# Patient Record
Sex: Female | Born: 1945 | Race: White | Hispanic: No | Marital: Married | Smoking: Never smoker
Health system: Southern US, Community
[De-identification: ages and names within clinical notes are randomized; demographics above are authoritative.]

## PROBLEM LIST (undated history)

## (undated) DIAGNOSIS — M1990 Unspecified osteoarthritis, unspecified site: Secondary | ICD-10-CM

## (undated) DIAGNOSIS — I10 Essential (primary) hypertension: Secondary | ICD-10-CM

## (undated) DIAGNOSIS — T7840XA Allergy, unspecified, initial encounter: Secondary | ICD-10-CM

## (undated) DIAGNOSIS — C801 Malignant (primary) neoplasm, unspecified: Secondary | ICD-10-CM

## (undated) DIAGNOSIS — J45909 Unspecified asthma, uncomplicated: Secondary | ICD-10-CM

## (undated) DIAGNOSIS — E039 Hypothyroidism, unspecified: Secondary | ICD-10-CM

## (undated) HISTORY — PX: NISSEN FUNDOPLICATION: SHX2091

## (undated) HISTORY — PX: TONSILLECTOMY: SUR1361

## (undated) HISTORY — DX: Allergy, unspecified, initial encounter: T78.40XA

## (undated) HISTORY — PX: EYE SURGERY: SHX253

## (undated) HISTORY — PX: EAR MASTOIDECTOMY W/ COCHLEAR IMPLANT W/ LANDMARK: SHX1483

---

## 2011-03-19 DIAGNOSIS — Z9889 Other specified postprocedural states: Secondary | ICD-10-CM | POA: Insufficient documentation

## 2014-09-10 HISTORY — PX: OSSICULAR RECONSTRUCTION: SHX2135

## 2014-09-25 DIAGNOSIS — J302 Other seasonal allergic rhinitis: Secondary | ICD-10-CM | POA: Insufficient documentation

## 2014-09-25 DIAGNOSIS — I10 Essential (primary) hypertension: Secondary | ICD-10-CM | POA: Insufficient documentation

## 2014-09-25 DIAGNOSIS — E039 Hypothyroidism, unspecified: Secondary | ICD-10-CM | POA: Insufficient documentation

## 2014-10-09 DIAGNOSIS — H90A32 Mixed conductive and sensorineural hearing loss, unilateral, left ear with restricted hearing on the contralateral side: Secondary | ICD-10-CM | POA: Insufficient documentation

## 2015-09-10 DIAGNOSIS — E782 Mixed hyperlipidemia: Secondary | ICD-10-CM | POA: Insufficient documentation

## 2015-09-10 DIAGNOSIS — M1990 Unspecified osteoarthritis, unspecified site: Secondary | ICD-10-CM | POA: Insufficient documentation

## 2015-09-28 HISTORY — PX: TYMPANOPLASTY: SHX33

## 2017-09-16 DIAGNOSIS — H9190 Unspecified hearing loss, unspecified ear: Secondary | ICD-10-CM | POA: Insufficient documentation

## 2017-09-16 DIAGNOSIS — I8390 Asymptomatic varicose veins of unspecified lower extremity: Secondary | ICD-10-CM | POA: Insufficient documentation

## 2017-11-20 DIAGNOSIS — Z9889 Other specified postprocedural states: Secondary | ICD-10-CM | POA: Insufficient documentation

## 2017-11-20 DIAGNOSIS — H74312 Ankylosis of ear ossicles, left ear: Secondary | ICD-10-CM | POA: Insufficient documentation

## 2017-11-25 DIAGNOSIS — I8390 Asymptomatic varicose veins of unspecified lower extremity: Secondary | ICD-10-CM | POA: Insufficient documentation

## 2019-06-14 ENCOUNTER — Encounter: Payer: Self-pay | Admitting: *Deleted

## 2020-04-24 ENCOUNTER — Other Ambulatory Visit: Payer: Self-pay | Admitting: Orthopedic Surgery

## 2020-04-24 DIAGNOSIS — M19011 Primary osteoarthritis, right shoulder: Secondary | ICD-10-CM

## 2020-05-08 ENCOUNTER — Ambulatory Visit
Admission: RE | Admit: 2020-05-08 | Discharge: 2020-05-08 | Disposition: A | Discharge status: Home / Self Care | Payer: Medicare Other | Source: Ambulatory Visit | Attending: Orthopedic Surgery | Admitting: Orthopedic Surgery

## 2020-05-08 ENCOUNTER — Other Ambulatory Visit: Payer: Self-pay

## 2020-05-08 DIAGNOSIS — M19011 Primary osteoarthritis, right shoulder: Secondary | ICD-10-CM

## 2020-06-20 NOTE — Patient Instructions (Addendum)
DUE TO COVID-19 ONLY ONE VISITOR IS ALLOWED TO COME WITH YOU AND STAY IN THE WAITING ROOM ONLY DURING PRE OP AND PROCEDURE DAY OF SURGERY. THE 1 VISITOR  MAY VISIT WITH YOU AFTER SURGERY IN YOUR PRIVATE ROOM DURING VISITING HOURS ONLY!  YOU NEED TO HAVE A COVID 19 TEST ON: 07/02/20 @ 10:30 am , THIS TEST MUST BE DONE BEFORE SURGERY,  COVID TESTING SITE [place] [id number], IT IS ON THE RIGHT GOING OUT [address] APPROXIMATELY  2 MINUTES PAST [employer] ON THE RIGHT. ONCE YOUR COVID TEST IS COMPLETED,  PLEASE BEGIN THE QUARANTINE INSTRUCTIONS AS OUTLINED IN YOUR HANDOUT.                Chelsea Mcguire    Your procedure is scheduled on: 07/05/20   Report to [facility] Main  Entrance   Report to short stay at: 5:30 AM     Call this number if you have problems the morning of surgery [phone]    Remember:   NO SOLID FOOD AFTER MIDNIGHT THE NIGHT PRIOR TO SURGERY. NOTHING BY MOUTH EXCEPT CLEAR LIQUIDS UNTIL: 4:30 am . PLEASE FINISH ENSURE DRINK PER SURGEON ORDER  WHICH NEEDS TO BE COMPLETED AT: 4:30 am.  CLEAR LIQUID DIET   Foods Allowed                                                                     Foods Excluded  Coffee and tea, regular and decaf                             liquids that you cannot  Plain Jell-O any favor except red or purple                                           see through such as: Fruit ices (not with fruit pulp)                                     milk, soups, orange juice  Iced Popsicles                                    All solid food Carbonated beverages, regular and diet                                    Cranberry, grape and apple juices Sports drinks like Gatorade Lightly seasoned clear broth or consume(fat free) Sugar, honey syrup  Sample Menu Breakfast                                Lunch  Supper Cranberry juice                    Beef broth                             Chicken broth Jell-O                                     Grape juice                           Apple juice Coffee or tea                        Jell-O                                      Popsicle                                                Coffee or tea                        Coffee or tea  _____________________________________________________________________   BRUSH YOUR TEETH MORNING OF SURGERY AND RINSE YOUR MOUTH OUT, NO CHEWING GUM CANDY OR MINTS.     Take these medicines the morning of surgery with A SIP OF WATER: amlodipine,levothyroxine.                                You may not have any metal on your body including hair pins and              piercings  Do not wear jewelry, make-up, lotions, powders or perfumes, deodorant             Do not wear nail polish on your fingernails.  Do not shave  48 hours prior to surgery.             Do not bring valuables to the hospital. [place].  Contacts, dentures or bridgework may not be worn into surgery.  Leave suitcase in the car. After surgery it may be brought to your room.     Patients discharged the day of surgery will not be allowed to drive home. IF YOU ARE HAVING SURGERY AND GOING HOME THE SAME DAY, YOU MUST HAVE AN ADULT TO DRIVE YOU HOME AND BE WITH YOU FOR 24 HOURS. YOU MAY GO HOME BY TAXI OR UBER OR ORTHERWISE, BUT AN ADULT MUST ACCOMPANY YOU HOME AND STAY WITH YOU FOR 24 HOURS.  Name and phone number of your driver:  Special Instructions: N/A              Please read over the following fact sheets you were given: _____________________________________________________________________        [facility] - Preparing for Surgery Before surgery, you can play an important role.  Because skin is not sterile, your skin needs to  be as free of germs as possible.  You can reduce the number of germs on your skin by washing with CHG (chlorahexidine gluconate) soap before surgery.   CHG is an antiseptic cleaner which kills germs and bonds with the skin to continue killing germs even after washing. Please DO NOT use if you have an allergy to CHG or antibacterial soaps.  If your skin becomes reddened/irritated stop using the CHG and inform your nurse when you arrive at Short Stay. Do not shave (including legs and underarms) for at least 48 hours prior to the first CHG shower.  You may shave your face/neck. Please follow these instructions carefully:  1.  Shower with CHG Soap the night before surgery and the  morning of Surgery.  2.  If you choose to wash your hair, wash your hair first as usual with your  normal  shampoo.  3.  After you shampoo, rinse your hair and body thoroughly to remove the  shampoo.                           4.  Use CHG as you would any other liquid soap.  You can apply chg directly  to the skin and wash                       Gently with a scrungie or clean washcloth.  5.  Apply the CHG Soap to your body ONLY FROM THE NECK DOWN.   Do not use on face/ open                           Wound or open sores. Avoid contact with eyes, ears mouth and genitals (private parts).                       Wash face,  Genitals (private parts) with your normal soap.             6.  Wash thoroughly, paying special attention to the area where your surgery  will be performed.  7.  Thoroughly rinse your body with warm water from the neck down.  8.  DO NOT shower/wash with your normal soap after using and rinsing off  the CHG Soap.                9.  Pat yourself dry with a clean towel.            10.  Wear clean pajamas.            11.  Place clean sheets on your bed the night of your first shower and do not  sleep with pets. Day of Surgery : Do not apply any lotions/deodorants the morning of surgery.  Please wear clean clothes to the hospital/surgery center.  FAILURE TO FOLLOW THESE INSTRUCTIONS MAY RESULT IN THE CANCELLATION OF YOUR SURGERY PATIENT  SIGNATURE_________________________________  NURSE SIGNATURE__________________________________  ________________________________________________________________________  [facility]- Preparing for Total Shoulder Arthroplasty    Before surgery, you can play an important role. Because skin is not sterile, your skin needs to be as free of germs as possible. You can reduce the number of germs on your skin by using the following products.  Benzoyl Peroxide Gel o Reduces the number of germs present on the skin o Applied twice a day to shoulder area starting two days before surgery    ==================================================================  Please follow these instructions carefully:  BENZOYL PEROXIDE 5% GEL  Please do not use if you have an allergy to benzoyl peroxide.   If your skin becomes reddened/irritated stop using the benzoyl peroxide.  Starting two days before surgery, apply as follows: 1. Apply benzoyl peroxide in the morning and at night. Apply after taking a shower. If you are not taking a shower clean entire shoulder front, back, and side along with the armpit with a clean wet washcloth.  2. Place a quarter-sized dollop on your shoulder and rub in thoroughly, making sure to cover the front, back, and side of your shoulder, along with the armpit.   2 days before ____ AM   ____ PM              1 day before ____ AM   ____ PM                         3. Do this twice a day for two days.  (Last application is the night before surgery, AFTER using the CHG soap as described below).  4. Do NOT apply benzoyl peroxide gel on the day of surgery.   Incentive Spirometer  An incentive spirometer is a tool that can help keep your lungs clear and active. This tool measures how well you are filling your lungs with each breath. Taking long deep breaths may help reverse or decrease the chance of developing breathing (pulmonary) problems (especially infection) following:  A long  period of time when you are unable to move or be active. BEFORE THE PROCEDURE   If the spirometer includes an indicator to show your best effort, your nurse or respiratory therapist will set it to a desired goal.  If possible, sit up straight or lean slightly forward. Try not to slouch.  Hold the incentive spirometer in an upright position. INSTRUCTIONS FOR USE  1. Sit on the edge of your bed if possible, or sit up as far as you can in bed or on a chair. 2. Hold the incentive spirometer in an upright position. 3. Breathe out normally. 4. Place the mouthpiece in your mouth and seal your lips tightly around it. 5. Breathe in slowly and as deeply as possible, raising the piston or the ball toward the top of the column. 6. Hold your breath for 3-5 seconds or for as long as possible. Allow the piston or ball to fall to the bottom of the column. 7. Remove the mouthpiece from your mouth and breathe out normally. 8. Rest for a few seconds and repeat Steps 1 through 7 at least 10 times every 1-2 hours when you are awake. Take your time and take a few normal breaths between deep breaths. 9. The spirometer may include an indicator to show your best effort. Use the indicator as a goal to work toward during each repetition. 10. After each set of 10 deep breaths, practice coughing to be sure your lungs are clear. If you have an incision (the cut made at the time of surgery), support your incision when coughing by placing a pillow or rolled up towels firmly against it. Once you are able to get out of bed, walk around indoors and cough well. You may stop using the incentive spirometer when instructed by your caregiver.  RISKS AND COMPLICATIONS  Take your time so you do not get dizzy or light-headed.  If you are in pain, you may need to take or ask for  pain medication before doing incentive spirometry. It is harder to take a deep breath if you are having pain. AFTER USE  Rest and breathe slowly and  easily.  It can be helpful to keep track of a log of your progress. Your caregiver can provide you with a simple table to help with this. If you are using the spirometer at home, follow these instructions: [place] IF:   You are having difficultly using the spirometer.  You have trouble using the spirometer as often as instructed.  Your pain medication is not giving enough relief while using the spirometer.  You develop fever of 100.5 F (38.1 C) or higher. SEEK IMMEDIATE MEDICAL CARE IF:   You cough up bloody sputum that had not been present before.  You develop fever of 102 F (38.9 C) or greater.  You develop worsening pain at or near the incision site. MAKE SURE YOU:   Understand these instructions.  Will watch your condition.  Will get help right away if you are not doing well or get worse. Document Released: 03/09/2007 Document Revised: 01/19/2012 Document Reviewed: 05/10/2007 [facility] Patient Information 2014 [place], [place].   ________________________________________________________________________

## 2020-06-21 ENCOUNTER — Encounter (HOSPITAL_COMMUNITY)
Admission: RE | Admit: 2020-06-21 | Discharge: 2020-06-21 | Disposition: A | Discharge status: Home / Self Care | Payer: Medicare Other | Source: Ambulatory Visit | Attending: Orthopedic Surgery | Admitting: Orthopedic Surgery

## 2020-06-21 ENCOUNTER — Encounter (HOSPITAL_COMMUNITY): Payer: Self-pay

## 2020-06-21 ENCOUNTER — Other Ambulatory Visit: Payer: Self-pay

## 2020-06-21 DIAGNOSIS — Z01818 Encounter for other preprocedural examination: Secondary | ICD-10-CM | POA: Diagnosis present

## 2020-06-21 HISTORY — DX: Hypothyroidism, unspecified: E03.9

## 2020-06-21 HISTORY — DX: Malignant (primary) neoplasm, unspecified: C80.1

## 2020-06-21 HISTORY — DX: Unspecified osteoarthritis, unspecified site: M19.90

## 2020-06-21 HISTORY — DX: Unspecified asthma, uncomplicated: J45.909

## 2020-06-21 HISTORY — DX: Essential (primary) hypertension: I10

## 2020-06-21 LAB — SURGICAL PCR SCREEN
MRSA, PCR: NEGATIVE
Staphylococcus aureus: NEGATIVE

## 2020-06-21 LAB — CBC
HCT: 42.5 % (ref 36.0–46.0)
Hemoglobin: 13.8 g/dL (ref 12.0–15.0)
MCH: 30.4 pg (ref 26.0–34.0)
MCHC: 32.5 g/dL (ref 30.0–36.0)
MCV: 93.6 fL (ref 80.0–100.0)
Platelets: 279 10*3/uL (ref 150–400)
RBC: 4.54 MIL/uL (ref 3.87–5.11)
RDW: 12.7 % (ref 11.5–15.5)
WBC: 9.6 10*3/uL (ref 4.0–10.5)
nRBC: 0 % (ref 0.0–0.2)

## 2020-06-21 LAB — BASIC METABOLIC PANEL
Anion gap: 6 (ref 5–15)
BUN: 13 mg/dL (ref 8–23)
CO2: 28 mmol/L (ref 22–32)
Calcium: 9.3 mg/dL (ref 8.9–10.3)
Chloride: 105 mmol/L (ref 98–111)
Creatinine, Ser: 0.71 mg/dL (ref 0.44–1.00)
GFR calc Af Amer: >60 mL/min (ref >60)
GFR calc non Af Amer: >60 mL/min (ref >60)
Glucose, Bld: 95 mg/dL (ref 70–99)
Potassium: 4.8 mmol/L (ref 3.5–5.1)
Sodium: 139 mmol/L (ref 135–145)

## 2020-06-21 NOTE — Progress Notes (Addendum)
COVID Vaccine Completed:yes Date COVID Vaccine completed:01/2020 COVID vaccine manufacturer: Pfizer   * Moderna   [employer]   PCP - Dr. Lonia Mad.: [facility] Cardiologist -   Chest x-ray -  EKG -  Stress Test -  ECHO -  Cardiac Cath -   Sleep Study -  CPAP -   Fasting Blood Sugar -  Checks Blood Sugar _____ times a day  Blood Thinner Instructions: Aspirin Instructions: Last Dose:  Anesthesia review:   Patient denies shortness of breath, fever, cough and chest pain at PAT appointment   Patient verbalized understanding of instructions that were given to them at the PAT appointment. Patient was also instructed that they will need to review over the PAT instructions again at home before surgery.

## 2020-07-02 ENCOUNTER — Other Ambulatory Visit (HOSPITAL_COMMUNITY)
Admission: RE | Admit: 2020-07-02 | Discharge: 2020-07-02 | Disposition: A | Discharge status: Home / Self Care | Payer: Medicare Other | Source: Ambulatory Visit | Attending: Orthopedic Surgery | Admitting: Orthopedic Surgery

## 2020-07-02 DIAGNOSIS — Z20822 Contact with and (suspected) exposure to covid-19: Secondary | ICD-10-CM | POA: Insufficient documentation

## 2020-07-02 DIAGNOSIS — Z01812 Encounter for preprocedural laboratory examination: Secondary | ICD-10-CM | POA: Insufficient documentation

## 2020-07-02 LAB — SARS CORONAVIRUS 2 (TAT 6-24 HRS): SARS Coronavirus 2: NEGATIVE

## 2020-07-04 NOTE — Anesthesia Preprocedure Evaluation (Addendum)
Anesthesia Evaluation  Patient identified by MRN, date of birth, ID band  Reviewed: Allergy & Precautions, NPO status , Patient's Chart, lab work & pertinent test results  Airway Mallampati: II  TM Distance: >3 FB Neck ROM: Full    Dental no notable dental hx.    Pulmonary asthma ,    Pulmonary exam normal breath sounds clear to auscultation       Cardiovascular Exercise Tolerance: Good hypertension, Normal cardiovascular exam Rhythm:Regular Rate:Normal  Normal sinus rhythm with sinus arrhythmia Right bundle branch block No old tracing to compare Confirmed by Minus Breeding [phone]) on 06/21/2020 8:59:59 PM   Neuro/Psych negative neurological ROS  negative psych ROS   GI/Hepatic   Endo/Other  Hypothyroidism   Renal/GU      Musculoskeletal  (+) Arthritis , Osteoarthritis,    Abdominal   Peds  Hematology   Anesthesia Other Findings   Reproductive/Obstetrics                            Anesthesia Physical Anesthesia Plan  ASA: II  Anesthesia Plan: General and Regional   Post-op Pain Management: GA combined w/ Regional for post-op pain   Induction:   PONV Risk Score and Plan: 3 and Dexamethasone, Ondansetron and Treatment may vary due to age or medical condition  Airway Management Planned: Oral ETT  Additional Equipment:   Intra-op Plan:   Post-operative Plan:   Informed Consent: I have reviewed the patients History and Physical, chart, labs and discussed the procedure including the risks, benefits and alternatives for the proposed anesthesia with the patient or authorized representative who has indicated his/her understanding and acceptance.       Plan Discussed with:   Anesthesia Plan Comments: (Interscalene block with Exparel + GETA. )       Anesthesia Quick Evaluation

## 2020-07-05 ENCOUNTER — Encounter (HOSPITAL_COMMUNITY): Payer: Self-pay | Admitting: Orthopedic Surgery

## 2020-07-05 ENCOUNTER — Ambulatory Visit (HOSPITAL_COMMUNITY)
Admission: RE | Admit: 2020-07-05 | Discharge: 2020-07-05 | Disposition: A | Discharge status: Home / Self Care | Payer: Medicare Other | Source: Other Acute Inpatient Hospital | Attending: Orthopedic Surgery | Admitting: Orthopedic Surgery

## 2020-07-05 ENCOUNTER — Ambulatory Visit (HOSPITAL_COMMUNITY): Payer: Medicare Other | Admitting: Anesthesiology

## 2020-07-05 ENCOUNTER — Other Ambulatory Visit: Payer: Self-pay

## 2020-07-05 ENCOUNTER — Encounter (HOSPITAL_COMMUNITY)
Admission: RE | Disposition: A | Discharge status: Home / Self Care | Payer: Self-pay | Source: Other Acute Inpatient Hospital | Attending: Orthopedic Surgery

## 2020-07-05 DIAGNOSIS — J45909 Unspecified asthma, uncomplicated: Secondary | ICD-10-CM | POA: Diagnosis not present

## 2020-07-05 DIAGNOSIS — M19011 Primary osteoarthritis, right shoulder: Secondary | ICD-10-CM | POA: Insufficient documentation

## 2020-07-05 DIAGNOSIS — Z79899 Other long term (current) drug therapy: Secondary | ICD-10-CM | POA: Diagnosis not present

## 2020-07-05 DIAGNOSIS — E039 Hypothyroidism, unspecified: Secondary | ICD-10-CM | POA: Diagnosis not present

## 2020-07-05 DIAGNOSIS — I10 Essential (primary) hypertension: Secondary | ICD-10-CM | POA: Insufficient documentation

## 2020-07-05 DIAGNOSIS — Z79890 Hormone replacement therapy (postmenopausal): Secondary | ICD-10-CM | POA: Insufficient documentation

## 2020-07-05 HISTORY — PX: TOTAL SHOULDER ARTHROPLASTY: SHX126

## 2020-07-05 SURGERY — ARTHROPLASTY, SHOULDER, TOTAL
Anesthesia: Regional | Site: Shoulder | Laterality: Right

## 2020-07-05 MED ORDER — CEFAZOLIN SODIUM-DEXTROSE 2-4 GM/100ML-% IV SOLN
2.0000 g | INTRAVENOUS | Status: AC
  Administered 2020-07-05: 2 g via INTRAVENOUS
  Filled 2020-07-05: qty 100

## 2020-07-05 MED ORDER — LACTATED RINGERS IV BOLUS
250.0000 mL | Freq: Once | INTRAVENOUS | Status: AC
  Administered 2020-07-05: 250 mL via INTRAVENOUS

## 2020-07-05 MED ORDER — FENTANYL CITRATE (PF) 100 MCG/2ML IJ SOLN
25.0000 ug | INTRAMUSCULAR | Status: DC | PRN
  Administered 2020-07-05: 50 ug via INTRAVENOUS

## 2020-07-05 MED ORDER — 0.9 % SODIUM CHLORIDE (POUR BTL) OPTIME
TOPICAL | Status: DC | PRN
  Administered 2020-07-05: 1000 mL

## 2020-07-05 MED ORDER — PROPOFOL 10 MG/ML IV BOLUS
INTRAVENOUS | Status: DC | PRN
  Administered 2020-07-05: 130 mg via INTRAVENOUS

## 2020-07-05 MED ORDER — LACTATED RINGERS IV BOLUS
500.0000 mL | Freq: Once | INTRAVENOUS | Status: AC
  Administered 2020-07-05: 500 mL via INTRAVENOUS

## 2020-07-05 MED ORDER — PHENYLEPHRINE HCL-NACL 10-0.9 MG/250ML-% IV SOLN
INTRAVENOUS | Status: DC | PRN
  Administered 2020-07-05: 50 ug/min via INTRAVENOUS

## 2020-07-05 MED ORDER — ONDANSETRON HCL 4 MG/2ML IJ SOLN
INTRAMUSCULAR | Status: AC
  Filled 2020-07-05: qty 2

## 2020-07-05 MED ORDER — FENTANYL CITRATE (PF) 250 MCG/5ML IJ SOLN
INTRAMUSCULAR | Status: DC | PRN
  Administered 2020-07-05 (×2): 50 ug via INTRAVENOUS

## 2020-07-05 MED ORDER — ONDANSETRON HCL 4 MG PO TABS: 4.0000 mg | ORAL_TABLET | Freq: Three times a day (TID) | ORAL | 0 refills | Status: DC | PRN

## 2020-07-05 MED ORDER — LIDOCAINE 2% (20 MG/ML) 5 ML SYRINGE
INTRAMUSCULAR | Status: DC | PRN
  Administered 2020-07-05: 60 mg via INTRAVENOUS

## 2020-07-05 MED ORDER — ROCURONIUM BROMIDE 10 MG/ML (PF) SYRINGE
PREFILLED_SYRINGE | INTRAVENOUS | Status: AC
  Filled 2020-07-05: qty 10

## 2020-07-05 MED ORDER — BUPIVACAINE LIPOSOME 1.3 % IJ SUSP
INTRAMUSCULAR | Status: DC | PRN
  Administered 2020-07-05: 10 mL

## 2020-07-05 MED ORDER — MIDAZOLAM HCL 2 MG/2ML IJ SOLN
INTRAMUSCULAR | Status: AC
  Filled 2020-07-05: qty 2

## 2020-07-05 MED ORDER — DEXAMETHASONE SODIUM PHOSPHATE 10 MG/ML IJ SOLN
INTRAMUSCULAR | Status: DC | PRN
  Administered 2020-07-05: 5 mg via INTRAVENOUS

## 2020-07-05 MED ORDER — SUGAMMADEX SODIUM 200 MG/2ML IV SOLN
INTRAVENOUS | Status: DC | PRN
  Administered 2020-07-05: 200 mg via INTRAVENOUS

## 2020-07-05 MED ORDER — FENTANYL CITRATE (PF) 250 MCG/5ML IJ SOLN
INTRAMUSCULAR | Status: AC
  Filled 2020-07-05: qty 5

## 2020-07-05 MED ORDER — BUPIVACAINE HCL (PF) 0.5 % IJ SOLN
INTRAMUSCULAR | Status: DC | PRN
  Administered 2020-07-05: 20 mL

## 2020-07-05 MED ORDER — PHENYLEPHRINE 40 MCG/ML (10ML) SYRINGE FOR IV PUSH (FOR BLOOD PRESSURE SUPPORT)
PREFILLED_SYRINGE | INTRAVENOUS | Status: DC | PRN
  Administered 2020-07-05 (×2): 80 ug via INTRAVENOUS

## 2020-07-05 MED ORDER — ROCURONIUM BROMIDE 10 MG/ML (PF) SYRINGE
PREFILLED_SYRINGE | INTRAVENOUS | Status: DC | PRN
  Administered 2020-07-05: 80 mg via INTRAVENOUS

## 2020-07-05 MED ORDER — STERILE WATER FOR IRRIGATION IR SOLN
Status: DC | PRN
  Administered 2020-07-05: 2000 mL

## 2020-07-05 MED ORDER — PROMETHAZINE HCL 25 MG/ML IJ SOLN: 6.2500 mg | INTRAMUSCULAR | Status: DC | PRN

## 2020-07-05 MED ORDER — AMISULPRIDE (ANTIEMETIC) 5 MG/2ML IV SOLN
INTRAVENOUS | Status: AC
  Filled 2020-07-05: qty 4

## 2020-07-05 MED ORDER — TRANEXAMIC ACID-NACL 1000-0.7 MG/100ML-% IV SOLN
1000.0000 mg | INTRAVENOUS | Status: AC
  Administered 2020-07-05: 1000 mg via INTRAVENOUS
  Filled 2020-07-05: qty 100

## 2020-07-05 MED ORDER — PROPOFOL 10 MG/ML IV BOLUS
INTRAVENOUS | Status: AC
  Filled 2020-07-05: qty 20

## 2020-07-05 MED ORDER — MIDAZOLAM HCL 5 MG/5ML IJ SOLN
INTRAMUSCULAR | Status: DC | PRN
  Administered 2020-07-05 (×2): 1 mg via INTRAVENOUS

## 2020-07-05 MED ORDER — ORAL CARE MOUTH RINSE: 15.0000 mL | Freq: Once | OROMUCOSAL | Status: AC

## 2020-07-05 MED ORDER — LACTATED RINGERS IV SOLN
INTRAVENOUS | Status: DC
  Administered 2020-07-05: 1000 mL via INTRAVENOUS

## 2020-07-05 MED ORDER — DEXAMETHASONE SODIUM PHOSPHATE 10 MG/ML IJ SOLN
INTRAMUSCULAR | Status: AC
  Filled 2020-07-05: qty 1

## 2020-07-05 MED ORDER — OXYCODONE-ACETAMINOPHEN 5-325 MG PO TABS: 1.0000 | ORAL_TABLET | ORAL | 0 refills | Status: DC | PRN

## 2020-07-05 MED ORDER — AMISULPRIDE (ANTIEMETIC) 5 MG/2ML IV SOLN
10.0000 mg | Freq: Once | INTRAVENOUS | Status: AC | PRN
  Administered 2020-07-05: 10 mg via INTRAVENOUS

## 2020-07-05 MED ORDER — CHLORHEXIDINE GLUCONATE 0.12 % MT SOLN
15.0000 mL | Freq: Once | OROMUCOSAL | Status: AC
  Administered 2020-07-05: 15 mL via OROMUCOSAL

## 2020-07-05 MED ORDER — ONDANSETRON HCL 4 MG/2ML IJ SOLN
INTRAMUSCULAR | Status: DC | PRN
  Administered 2020-07-05: 4 mg via INTRAVENOUS

## 2020-07-05 MED ORDER — CYCLOBENZAPRINE HCL 10 MG PO TABS: 5.0000 mg | ORAL_TABLET | Freq: Three times a day (TID) | ORAL | 1 refills | Status: DC | PRN

## 2020-07-05 MED ORDER — LIDOCAINE 2% (20 MG/ML) 5 ML SYRINGE
INTRAMUSCULAR | Status: AC
  Filled 2020-07-05: qty 5

## 2020-07-05 MED ORDER — FENTANYL CITRATE (PF) 100 MCG/2ML IJ SOLN
INTRAMUSCULAR | Status: AC
  Filled 2020-07-05: qty 2

## 2020-07-05 MED ORDER — ACETAMINOPHEN 500 MG PO TABS
1000.0000 mg | ORAL_TABLET | Freq: Once | ORAL | Status: AC
  Administered 2020-07-05: 1000 mg via ORAL
  Filled 2020-07-05: qty 2

## 2020-07-05 MED ORDER — PHENYLEPHRINE 40 MCG/ML (10ML) SYRINGE FOR IV PUSH (FOR BLOOD PRESSURE SUPPORT)
PREFILLED_SYRINGE | INTRAVENOUS | Status: AC
  Filled 2020-07-05: qty 10

## 2020-07-05 SURGICAL SUPPLY — 66 items
BAG ZIPLOCK 12X15 (MISCELLANEOUS) ×3 IMPLANT
BIT DRILL 2.0X128 (BIT) ×2 IMPLANT
BIT DRILL 2.0X128MM (BIT) ×1
BLADE SAW SGTL 83.5X18.5 (BLADE) ×3 IMPLANT
BODY TRUNION ECLIPSE 39 SL (Shoulder) ×3 IMPLANT
CALIBRATOR GLENOID VIP 5-D (SYSTAGENIX WOUND MANAGEMENT) ×3 IMPLANT
CEMENT BONE DEPUY (Cement) ×3 IMPLANT
COOLER ICEMAN CLASSIC (MISCELLANEOUS) ×3 IMPLANT
COVER BACK TABLE 60X90IN (DRAPES) ×3 IMPLANT
COVER SURGICAL LIGHT HANDLE (MISCELLANEOUS) ×3 IMPLANT
COVER WAND RF STERILE (DRAPES) ×3 IMPLANT
DERMABOND ADVANCED (GAUZE/BANDAGES/DRESSINGS) ×2
DERMABOND ADVANCED .7 DNX12 (GAUZE/BANDAGES/DRESSINGS) ×1 IMPLANT
DRAPE SHEET LG 3/4 BI-LAMINATE (DRAPES) ×3 IMPLANT
DRAPE SURG 17X11 SM STRL (DRAPES) ×3 IMPLANT
DRAPE U-SHAPE 47X51 STRL (DRAPES) ×3 IMPLANT
DRESSING AQUACEL AG SP 3.5X6 (GAUZE/BANDAGES/DRESSINGS) ×1 IMPLANT
DRSG AQUACEL AG ADV 3.5X10 (GAUZE/BANDAGES/DRESSINGS) ×3 IMPLANT
DRSG AQUACEL AG SP 3.5X6 (GAUZE/BANDAGES/DRESSINGS) ×3
DURAPREP 26ML APPLICATOR (WOUND CARE) ×6 IMPLANT
ELECT BLADE TIP CTD 4 INCH (ELECTRODE) ×3 IMPLANT
ELECT REM PT RETURN 15FT ADLT (MISCELLANEOUS) ×3 IMPLANT
FACESHIELD WRAPAROUND (MASK) ×12 IMPLANT
GLENOID WITH CLEAT SM (Miscellaneous) ×3 IMPLANT
GLOVE BIO SURGEON STRL SZ7.5 (GLOVE) ×3 IMPLANT
GLOVE BIO SURGEON STRL SZ8 (GLOVE) ×3 IMPLANT
GLOVE SS BIOGEL STRL SZ 7 (GLOVE) ×1 IMPLANT
GLOVE SS BIOGEL STRL SZ 7.5 (GLOVE) ×1 IMPLANT
GLOVE SUPERSENSE BIOGEL SZ 7 (GLOVE) ×2
GLOVE SUPERSENSE BIOGEL SZ 7.5 (GLOVE) ×2
GOWN STRL REUS W/TWL LRG LVL3 (GOWN DISPOSABLE) ×6 IMPLANT
HEAD HUMERAL ECLIPSE 39/18 (Shoulder) ×3 IMPLANT
IMPL ECLIPSE SPEEDCAP (Shoulder) ×1 IMPLANT
IMPLANT ECLIPSE SPEEDCAP (Shoulder) ×3 IMPLANT
KIT BASIN OR (CUSTOM PROCEDURE TRAY) ×3 IMPLANT
KIT TURNOVER KIT A (KITS) IMPLANT
MANIFOLD NEPTUNE II (INSTRUMENTS) ×3 IMPLANT
MARKER SKIN DUAL TIP RULER LAB (MISCELLANEOUS) ×3 IMPLANT
NEEDLE TAPERED W/ NITINOL LOOP (MISCELLANEOUS) IMPLANT
NS IRRIG 1000ML POUR BTL (IV SOLUTION) ×3 IMPLANT
PACK SHOULDER (CUSTOM PROCEDURE TRAY) ×3 IMPLANT
PAD COLD SHLDR WRAP-ON (PAD) ×3 IMPLANT
PEEK SWIVELOCK SHOU 3.9 (Orthopedic Implant) ×3 IMPLANT
PIN NITINOL TARGETER 2.8 (PIN) ×3 IMPLANT
PIN SET MODULAR GLENOID SYSTEM (PIN) ×3 IMPLANT
PROTECTOR NERVE ULNAR (MISCELLANEOUS) ×3 IMPLANT
RESTRAINT HEAD UNIVERSAL NS (MISCELLANEOUS) ×3 IMPLANT
SCREW MED ECLIPSE 35 (Screw) ×3 IMPLANT
SIZER ECLIPSE CAGE SCREW (ORTHOPEDIC DISPOSABLE SUPPLIES) ×3 IMPLANT
SLING ARM FOAM STRAP LRG (SOFTGOODS) IMPLANT
SLING ARM FOAM STRAP MED (SOFTGOODS) ×3 IMPLANT
SMARTMIX MINI TOWER (MISCELLANEOUS) ×3
SPONGE LAP 18X18 RF (DISPOSABLE) IMPLANT
SUCTION FRAZIER HANDLE 12FR (TUBING) ×2
SUCTION TUBE FRAZIER 12FR DISP (TUBING) ×1 IMPLANT
SUT FIBERWIRE #2 38 T-5 BLUE (SUTURE) ×3
SUT MNCRL AB 3-0 PS2 18 (SUTURE) ×3 IMPLANT
SUT MON AB 2-0 CT1 36 (SUTURE) ×3 IMPLANT
SUT VIC AB 1 CT1 36 (SUTURE) ×9 IMPLANT
SUTURE FIBERWR #2 38 T-5 BLUE (SUTURE) ×1 IMPLANT
SUTURE TAPE 1.3 40 TPR END (SUTURE) ×2 IMPLANT
SUTURETAPE 1.3 40 TPR END (SUTURE) ×6
TOWEL OR 17X26 10 PK STRL BLUE (TOWEL DISPOSABLE) ×3 IMPLANT
TOWEL OR NON WOVEN STRL DISP B (DISPOSABLE) ×3 IMPLANT
TOWER SMARTMIX MINI (MISCELLANEOUS) ×1 IMPLANT
WATER STERILE IRR 1000ML POUR (IV SOLUTION) ×6 IMPLANT

## 2020-07-05 NOTE — Anesthesia Postprocedure Evaluation (Signed)
Anesthesia Post Note  Patient: Chelsea Mcguire  Procedure(s) Performed: Right total shoulder arthroplasty (Right Shoulder)     Patient location during evaluation: PACU Anesthesia Type: Regional and General Level of consciousness: awake and alert Pain management: pain level controlled Vital Signs Assessment: post-procedure vital signs reviewed and stable Respiratory status: spontaneous breathing, nonlabored ventilation and respiratory function stable Cardiovascular status: blood pressure returned to baseline and stable Postop Assessment: no apparent nausea or vomiting Anesthetic complications: no   No complications documented.  Last Vitals:  Vitals:   07/05/20 1015 07/05/20 1030  BP: 131/69 (!) 126/59  Pulse: 61 (!) 56  Resp: 12 15  Temp: (!) 36.3 C 36.4 C  SpO2: 94% 94%    Last Pain:  Vitals:   07/05/20 1030  TempSrc:   PainSc: [place]

## 2020-07-05 NOTE — Transfer of Care (Signed)
Immediate Anesthesia Transfer of Care Note  Patient: Chelsea Mcguire  Procedure(s) Performed: Right total shoulder arthroplasty (Right Shoulder)  Patient Location: PACU  Anesthesia Type:General  Level of Consciousness: sedated  Airway & Oxygen Therapy: Patient Spontanous Breathing and Patient connected to face mask oxygen  Post-op Assessment: Report given to RN and Post -op Vital signs reviewed and stable  Post vital signs: Reviewed and stable  Last Vitals:  Vitals Value Taken Time  BP    Temp    Pulse 55 07/05/20 0943  Resp 12 07/05/20 0943  SpO2 100 % 07/05/20 0943  Vitals shown include unvalidated device data.  Last Pain:  Vitals:   07/05/20 0623  TempSrc: Oral      Patients Stated Pain Goal: 4 (02/54/27 0623)  Complications: No complications documented.

## 2020-07-05 NOTE — Op Note (Signed)
07/05/2020  9:30 AM  PATIENT:   Chelsea Mcguire  74 y.o. female  PRE-OPERATIVE DIAGNOSIS:  Right shoulder osteoarthritis  POST-OPERATIVE DIAGNOSIS: Same  PROCEDURE: Right shoulder stemless anatomic arthroplasty utilizing a 39 x 18 humeral head on a 39 trunnion with a medium cage screw, and a small glenoid.  SURGEON:  Marin Shutter M.D.  ASSISTANTS: Jenetta Loges, PA-C  ANESTHESIA:   General endotracheal and interscalene block with Exparel  EBL: 100 cc  SPECIMEN: None  Drains: None   PATIENT DISPOSITION:  PACU - hemodynamically stable.    PLAN OF CARE: Discharge to home after PACU  Brief history:  Chelsea Mcguire is a 74 year old female who has had chronic and progressive increasing right shoulder pain related to severe glenohumeral joint osteoarthritis.  Due to her increasing functional imitations and failure to respond to conservative management she is brought to the operating this time for planned right shoulder anatomic arthroplasty.  Preoperatively, I counseled the patient regarding treatment options and risks versus benefits thereof.  Possible surgical complications were all reviewed including potential for bleeding, infection, neurovascular injury, persistent pain, loss of motion, anesthetic complication, failure of the implant, and possible need for additional surgery. They understand and accept and agrees with our planned procedure.   Procedure in detail:  After undergoing routine preop evaluation patient received prophylactic antibiotics and interscalene block with Exparel established in the holding area by the anesthesia department.  Patient subsequently placed supine on the operating table and underwent the smooth induction of a general endotracheal anesthesia.  Subsequently placed into the beachchair position and appropriately padded and protected.  The right shoulder girdle region was sterilely prepped and draped in standard fashion.  Timeout was called.  An  anterior deltopectoral approach to the right shoulder is made through an 8 cm incision.  Skin flaps were elevated dissection carried deeply and the deltopectoral interval was developed from proximal to distal with the vein taken laterally.  Adhesions were divided from beneath the deltoid and the conjoined tendon was mobilized and retracted medially.  The upper centimeter the pectoralis major was tenotomized for exposure and the long head biceps tendon was then tenodesed at the upper border the pectoralis major with the proximal segment then unroofed and excised.  The rotator cuff was split along the rotator interval to the base of the coracoid and the subscapularis insertion was then identified superiorly and inferiorly and once this was delineated an oscillating saw was used to perform a lesser tuberosity osteotomy removing a thin wafer of bone proximally 2 mm in thickness and the subscap was then tagged mobilized and reflected medially.  The capsular attachments were then divided from the anterior and infra margin of the humeral neck and the humeral head was delivered through the wound.  We used our extra medullary guide to outline the proposed humeral head resection which was performed with an oscillating saw at approximately 30 degrees retroversion matching the native retroversion.  The marginal osteophytes on the humeral neck were then removed with rondure.  We then sized the humeral metaphysis to add a 39 and provisionally outlined the position of our humeral head using the appropriate cutting guide.  Once this was established and a metal cap was then placed over the cut proximal humeral surface.  We then exposed the glenoid with appropriate retractors gaining complete visualization of the periphery.  A circumferential labral resection was then completed.  Once we had visualization of the entire periphery of the glenoid we used our glenoid guide which  had been set up based on our preoperative CT scan.  This  allowed precise positioning of our glenoid guide pin and the glenoid was then reamed to a stable subchondral bony bed.  All debris was then removed.  The glenoid preparation was then completed with our central drill followed by the superior and inferior peg and slot respectively and then broached and trial showed excellent fit.  At this point the glenoid was meticulously cleaned and dried and cement was then mixed and introduced into the superior and inferior peg and slot respectively and the final size small glenoid was impacted with excellent fit and fixation.  We then returned our attention back to the proximal humerus where the size 39 trunnion was positioned in our central cage screw we was packed with cancellous bone from the resected humeral head and the cage screw was then inserted obtaining excellent purchase and fixation.  We then performed trial reductions and felt that a 39 x 18 head gave [place] the best soft tissue balance with approximately 50% translation over the glenoid and good elasticity good stability good soft tissue balance.  This point the trial was removed.  We utilized the subscapularis repair kit to place 3 medial row suture anchors for the LTO repair.  Once this was completed then we cleaned the [facility] taper and impacted her final 39 x 18 head.  Final reduction showed excellent soft tissue balancing good motion good stability.  At this point we performed our repair of the subscapularis after it had been mobilized and confirmed with good elasticity.  The medial row sutures were all passed equidistant across the width of the bone tendon junction of the LTO.  The suture limbs were then shuttled into 2 lateral anchors which were placed into the bicipital groove and properly tensioned such that the LTO was anatomically repositioned over the donor site with good stability.  We then repaired the rotator interval with a series of figure-of-eight suture tape sutures.  Upon completion the arm easily  achieved 30 degrees of external rotation without excessive tension on the subscap repair.  The wound is then copiously irrigated.  Hemostasis was obtained.  The deltopectoral interval was reapproximated with a series of figure-of-eight and 1 Vicryl sutures.  2-0 Vicryl used for subcu layer and intracuticular 3-0 Monocryl for the skin followed by Dermabond and Aquacel dressing the right arm was then placed in a sling the patient was awakened, extubated, and taken to the recovery room in stable condition.  Jenetta Loges, PA-C was utilized as an Environmental consultant throughout this case, essential for help with positioning the patient, positioning extremity, tissue manipulation, implantation of the prosthesis, suture management, wound closure, and intraoperative decision-making.  Marin Shutter MD   Contact # [phone]

## 2020-07-05 NOTE — Addendum Note (Signed)
Addendum  created 07/05/20 1226 by Effie Berkshire, MD   Order list changed

## 2020-07-05 NOTE — Progress Notes (Signed)
Occupational Therapy Evaluation  s/p shoulder replacement without functional use of right dominant upper extremity secondary to effects of surgery and interscalene block and shoulder precautions. Therapist provided education and instruction to patient and spouse in regards to exercises, precautions, positioning, donning upper extremity clothing and bathing while maintaining shoulder precautions, ice and edema management and donning/doffing sling. Patient and spouse verbalized understanding. Unable to assist patient with dressing at this time due to nausea and needing IV medications, instructed patient's spouse in all compensatory strategies and verbalized understanding. Patient to follow up with MD for further therapy needs.     07/05/20 1400  OT Visit Information  Last OT Received On 07/05/20  Assistance Needed +1  History of Present Illness Patient is a 74 year old female admitted 8/26 for R shoulder arthroplasty. PMH includes HTN, asthma, skin cancer  Precautions  Precautions Shoulder  Type of Shoulder Precautions elbow wrist hand AROM ok, pendulum and lap slides ok, P/AA/AROM sh FF 60 ABD 45 ER 20   Shoulder Interventions Shoulder sling/immobilizer;Off for dressing/bathing/exercises (off in controlled environment)  Precaution Booklet Issued Yes (comment)  Required Braces or Orthoses Sling  Restrictions  Weight Bearing Restrictions Yes  RUE Weight Bearing NWB  Home Living  Family/patient expects to be discharged to: Private residence  Living Arrangements Spouse/significant other  Available Help at Discharge Family;Available 24 hours/day  Type of Home House  Home Access Stairs to enter  Entrance Stairs-Number of Steps threshold step  Home Layout Two level;Laundry or work area in Marketing executive held shower head  Prior Function  Level of Independence Independent  Communication  Communication No  difficulties  Pain Assessment  Pain Assessment Faces  Faces Pain Scale 0  Cognition  Arousal/Alertness Lethargic  Behavior During Therapy WFL for tasks assessed/performed  Overall Cognitive Status Within Functional Limits for tasks assessed  General Comments patient reports feeling nauseous and groggy throughout evaluation  Upper Extremity Assessment  Upper Extremity Assessment RUE deficits/detail  RUE Deficits / Details + nerve block  ADL  General ADL Comments unable to assist with dressing as patient needed IV placed for nausea medicine, educated spouse on technique  Bed Mobility  General bed mobility comments in recliner  Transfers  General transfer comment deferred due to nausea   General Comments  General comments (skin integrity, edema, etc.) BP in chair 145/63  Exercises  Exercises Shoulder;Other exercises  Shoulder Instructions  Donning/doffing shirt without moving shoulder Patient able to independently direct caregiver;Caregiver independent with task  Method for sponge bathing under operated UE Caregiver independent with task;Patient able to independently direct caregiver  Donning/doffing sling/immobilizer Caregiver independent with task;Patient able to independently direct caregiver  Correct positioning of sling/immobilizer Caregiver independent with task;Patient able to independently direct caregiver  Pendulum exercises (written home exercise program) Caregiver independent with task;Patient able to independently direct caregiver  ROM for elbow, wrist and digits of operated UE Caregiver independent with task;Patient able to independently direct caregiver  Sling wearing schedule (on at all times/off for ADL's) Caregiver independent with task;Patient able to independently direct caregiver  Proper positioning of operated UE when showering Caregiver independent with task;Patient able to independently direct caregiver  Positioning of UE while sleeping Caregiver independent with  task;Patient able to independently direct caregiver  Other Exercises  Other Exercises instruced patient and spouse in all prescribed exercises, provided hand outs  OT - End of Session  Equipment Utilized During Treatment  (sling)  Activity Tolerance  Patient limited by lethargy;Treatment limited secondary to medical complications (Comment) (nausea)  Patient left in chair;with call bell/phone within reach;with nursing/sitter in room;with family/visitor present  Nurse Communication  (nausea )  OT Assessment  OT Recommendation/Assessment Progress rehab of shoulder as ordered by MD at follow-up appointment  OT Visit Diagnosis Pain  Pain - Right/Left Right  Pain - part of body Shoulder  OT Problem List Pain;Impaired UE functional use  AM-PAC OT "6 Clicks" Daily Activity Outcome Measure (Version 2)  Help from another person eating meals? 3  Help from another person taking care of personal grooming? 3  Help from another person toileting, which includes using toliet, bedpan, or urinal? 3  Help from another person bathing (including washing, rinsing, drying)? 2  Help from another person to put on and taking off regular upper body clothing? 2  Help from another person to put on and taking off regular lower body clothing? 2  6 Click Score 15  OT Recommendation  Follow Up Recommendations Follow surgeon's recommendation for DC plan and follow-up therapies  OT Equipment None recommended by OT  Acute Rehab OT Goals  Patient Stated Goal feel better  OT Goal Formulation With patient  OT Time Calculation  OT Start Time (ACUTE ONLY) 1146  OT Stop Time (ACUTE ONLY) 1232  OT Time Calculation (min) 46 min  OT General Charges  $OT Visit 1 Visit  OT Evaluation  $OT Eval Low Complexity 1 Low  OT Treatments  $Self Care/Home Management  23-37 mins  Written Expression  Dominant Hand Right   Delbert Phenix OT OT pager: [phone]

## 2020-07-05 NOTE — H&P (Signed)
Chelsea Mcguire    Chief Complaint: Right shoulder osteoarthritis HPI: The patient is a 74 y.o. 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  Due to her increasing functional rotations and failure to respond to conservative management she is brought to the operating this time for planned right shoulder anatomic arthroplasty  Past Medical History:  Diagnosis Date  . Arthritis   . Asthma   . Cancer (HCC)    skin : lt. leg  . Hypertension   . Hypothyroidism     Past Surgical History:  Procedure Laterality Date  . EAR MASTOIDECTOMY W/ COCHLEAR IMPLANT W/ LANDMARK    . NISSEN FUNDOPLICATION      No family history on file.  Social History:  reports that she has never smoked. She has never used smokeless tobacco. She reports that she does not drink alcohol and does not use drugs.   Medications Prior to Admission  Medication Sig Dispense Refill  . amLODipine (NORVASC) 2.5 MG tablet Take 2.5 mg by mouth at bedtime.     . ASPERCREME LIDOCAINE EX Apply 1 application topically 3 (three) times daily as needed (pain (left arm)).    . Coenzyme Q10 (COQ10) 200 MG CAPS Take 200 mg by mouth at bedtime.    . cyanocobalamin (,VITAMIN B-12,) 1000 MCG/ML injection Inject 1,000 mcg into the muscle every 14 (fourteen) days.    . cyclobenzaprine (FLEXERIL) 10 MG tablet Take 5-10 mg by mouth at bedtime as needed for pain.    Marland Kitchen ibuprofen (ADVIL) 200 MG tablet Take 200-400 mg by mouth every 8 (eight) hours as needed (for pain.).    Marland Kitchen levothyroxine (SYNTHROID) 88 MCG tablet Take 88 mcg by mouth daily before breakfast.     . losartan (COZAAR) 100 MG tablet Take 100 mg by mouth at bedtime.     . Multiple Vitamin (MULTIVITAMIN WITH MINERALS) TABS tablet Take 1 tablet by mouth daily. Centrum For Women    . PREDNISOLON-GATIFLOX-BROMFENAC OP Apply 1 drop to eye in the morning, at noon, and at bedtime.    . simvastatin (ZOCOR) 20 MG tablet Take 20 mg by  mouth at bedtime.        Physical Exam: Right shoulder demonstrates painful and guarded motion as noted at her recent office visit.  Overall maintains excellent strength.  Plain radiographs confirm severe osteoarthritis with complete loss of joint space, subchondral sclerosis, and peripheral osteophyte formation.  Preoperative CT scan has been obtained to assist with operative planning.  Vitals     Assessment/Plan  Impression: Right shoulder osteoarthritis  Plan of Action: Procedure(s): Right total shoulder arthroplasty versus reverse shoulder arthroplasty  Chelsea Mcguire Chelsea Mcguire 07/05/2020, 5:59 AM Contact # [phone]

## 2020-07-05 NOTE — Anesthesia Procedure Notes (Signed)
Anesthesia Regional Block: Interscalene brachial plexus block   Pre-Anesthetic Checklist: ,, timeout performed, Correct Patient, Correct Site, Correct Laterality, Correct Procedure, Correct Position, site marked, Risks and benefits discussed,  Surgical consent,  Pre-op evaluation,  At surgeon's request and post-op pain management  Laterality: Right  Prep: chloraprep       Needles:  Injection technique: Single-shot  Needle Type: Stimiplex     Needle Length: 9cm  Needle Gauge: 21     Additional Needles:   Procedures:,,,, ultrasound used (permanent image in chart),,,,  Narrative:  Start time: 07/05/2020 6:55 AM End time: 07/05/2020 7:00 AM Injection made incrementally with aspirations every 5 mL.  Performed by: Personally  Anesthesiologist: Merlinda Frederick, MD

## 2020-07-05 NOTE — Anesthesia Procedure Notes (Signed)
Procedure Name: Intubation Date/Time: 07/05/2020 7:45 AM Performed by: Talbot Grumbling, CRNA Pre-anesthesia Checklist: Patient identified, Emergency Drugs available, Suction available and Patient being monitored Patient Re-evaluated:Patient Re-evaluated prior to induction Oxygen Delivery Method: Circle system utilized Preoxygenation: Pre-oxygenation with 100% oxygen Induction Type: IV induction Ventilation: Mask ventilation without difficulty Laryngoscope Size: Mac and 3 Grade View: Grade I Tube type: Oral Tube size: 7.0 mm Number of attempts: 1 Airway Equipment and Method: Stylet Placement Confirmation: ETT inserted through vocal cords under direct vision,  positive ETCO2 and breath sounds checked- equal and bilateral Secured at: 21 cm Tube secured with: Tape Dental Injury: Teeth and Oropharynx as per pre-operative assessment

## 2020-07-05 NOTE — Discharge Instructions (Signed)
 Kevin M. Supple, M.D., F.A.A.O.S. Orthopa[facility] Specializing in Arthroscopic and Reconstructive Surgery of the Shoulder [phone] [address] - [place], [place] [address] - Fax [phone]   POST-OP TOTAL SHOULDER REPLACEMENT INSTRUCTIONS  1. Follow up in the office for your first post-op appointment 10-14 days from the date of your surgery. If you do not already have a scheduled appointment, our office will contact you to schedule.  2. The bandage over your incision is waterproof. You may begin showering with this dressing on. You may leave this dressing on until first follow up appointment within 2 weeks. We prefer you leave this dressing in place until follow up however after 5-7 days if you are having itching or skin irritation and would like to remove it you may do so. Go slow and tug at the borders gently to break the bond the dressing has with the skin. At this point if there is no drainage it is okay to go without a bandage or you may cover it with a light guaze and tape. You can also expect significant bruising around your shoulder that will drift down your arm and into your chest wall. This is very normal and should resolve over several days.   3. Wear your sling/immobilizer at all times except to perform the exercises below or to occasionally let your arm dangle by your side to stretch your elbow. You also need to sleep in your sling immobilizer until instructed otherwise. It is ok to remove your sling if you are sitting in a controlled environment and allow your arm to rest in a position of comfort by your side or on your lap with pillows to give your neck and skin a break from the sling. You may remove it to allow arm to dangle by side to shower. If you are up walking around and when you go to sleep at night you need to wear it.  4. Range of motion to your elbow, wrist, and hand are encouraged 3-5 times daily. Exercise to your hand and fingers helps to reduce  swelling you may experience.   5. Prescriptions for a pain medication and a muscle relaxant are provided for you. It is recommended that if you are experiencing pain that you pain medication alone is not controlling, add the muscle relaxant along with the pain medication which can give additional pain relief. The first 1-2 days is generally the most severe of your pain and then should gradually decrease. As your pain lessens it is recommended that you decrease your use of the pain medications to an "as needed basis'" only and to always comply with the recommended dosages of the pain medications.  6. Pain medications can produce constipation along with their use. If you experience this, the use of an over the counter stool softener or laxative daily is recommended.   7. For additional questions or concerns, please do not hesitate to call the office. If after hours there is an answering service to forward your concerns to the physician on call.  8.Pain control following an exparel block  To help control your post-operative pain you received a nerve block  performed with Exparel which is a long acting anesthetic (numbing agent) which can provide pain relief and sensations of numbness (and relief of pain) in the operative shoulder and arm for up to 3 days. Sometimes it provides mixed relief, meaning you may still have numbness in certain areas of the arm but can still be able to   move  parts of that arm, hand, and fingers. We recommend that your prescribed pain medications  be used as needed. We do not feel it is necessary to "pre medicate" and "stay ahead" of pain.  Taking narcotic pain medications when you are not having any pain can lead to unnecessary and potentially dangerous side effects.    9. Use the ice machine as much as possible in the first 5-7 days from surgery, then you can wean its use to as needed. The ice typically needs to be replaced every 6 hours, instead of ice you can actually freeze  water bottles to put in the cooler and then fill water around them to avoid having to purchase ice. You can have spare water bottles freezing to allow you to rotate them once they have melted. Try to have a thin shirt or light cloth or towel under the ice wrap to protect your skin.   10.  We recommend that you avoid any dental work or cleaning in the first 3 months following your joint replacement. This is to help minimize the possibility of infection from the bacteria in your mouth that enters your bloodstream during dental work. We also recommend that you take an antibiotic prior to your dental work for the first year after your shoulder replacement to further help reduce that risk. Please simply contact our office for antibiotics to be sent to your pharmacy prior to dental work.  11. Dental Antibiotics:  In most cases prophylactic antibiotics for Dental procdeures after total joint surgery are not necessary.  Exceptions are as follows:  1. History of prior total joint infection  2. Severely immunocompromised (Organ Transplant, cancer chemotherapy, Rheumatoid biologic meds such as Humera)  3. Poorly controlled diabetes (A1C &gt; 8.0, blood glucose over 200)  If you have one of these conditions, contact your surgeon for an antibiotic prescription, prior to your dental procedure.   POST-OP EXERCISES  Pendulum Exercises  Perform pendulum exercises while standing and bending at the waist. Support your uninvolved arm on a table or chair and allow your operated arm to hang freely. Make sure to do these exercises passively - not using you shoulder muscles. These exercises can be performed once your nerve block effects have worn off.  Repeat 20 times. Do 3 sessions per day.     

## 2020-07-09 ENCOUNTER — Encounter (HOSPITAL_COMMUNITY): Payer: Self-pay | Admitting: Orthopedic Surgery

## 2020-08-10 ENCOUNTER — Encounter: Payer: Self-pay | Admitting: Orthopedic Surgery

## 2021-02-14 DIAGNOSIS — I4510 Unspecified right bundle-branch block: Secondary | ICD-10-CM | POA: Insufficient documentation

## 2021-08-05 IMAGING — CT CT SHOULDER*R* W/O CM
1 series · 12 of 14 positions shown, 15 images · non-contrast
Comparison: None.

CLINICAL DATA: Chronic progressive right shoulder pain and limited
range of motion. Primary osteoarthritis of the right shoulder.
Pre-surgical planning.

EXAM:
CT OF THE UPPER RIGHT EXTREMITY WITHOUT CONTRAST
TECHNIQUE: Multidetector CT imaging of the upper right extremity was performed
according to the standard protocol.

[Series 3: soft tissue · axial · 0.47mm/px · z∈[-234,-54]mm · 12 of 108 slices shown, 15 images]
[im 9/108  soft-tissue]
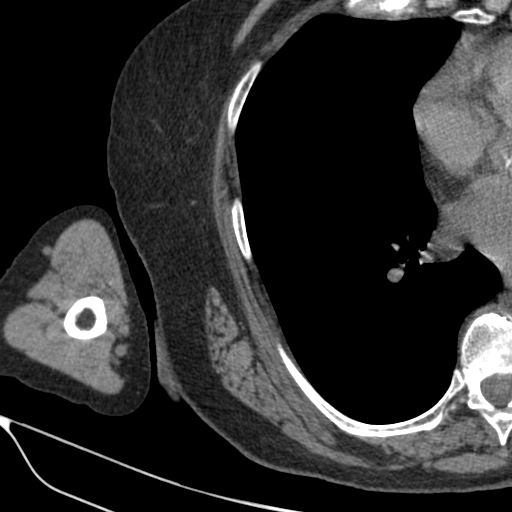
[im 9/108  bone]
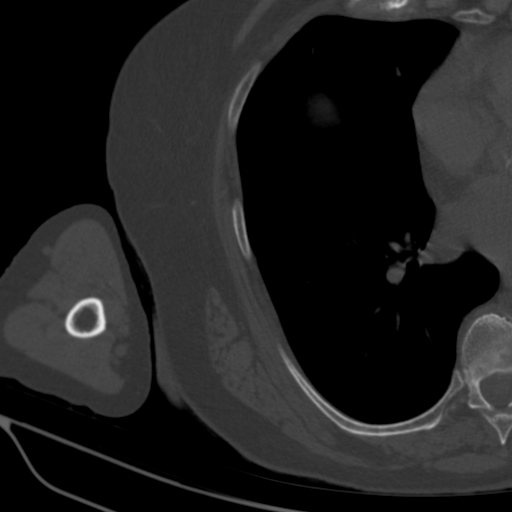
[im 17/108  bone]
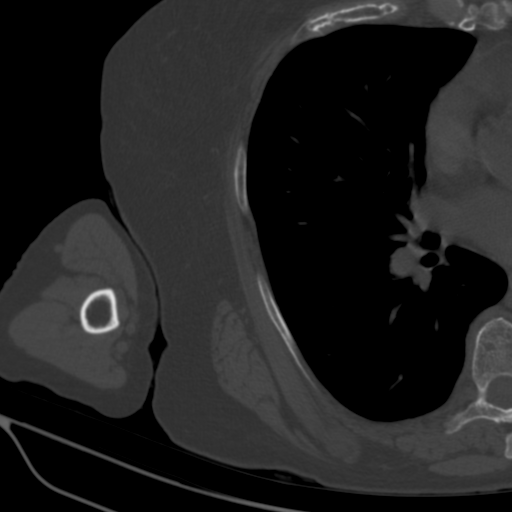
[im 25/108  bone]
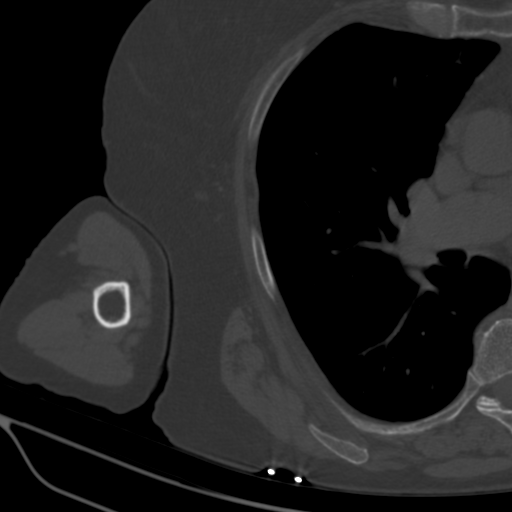
[im 33/108  bone]
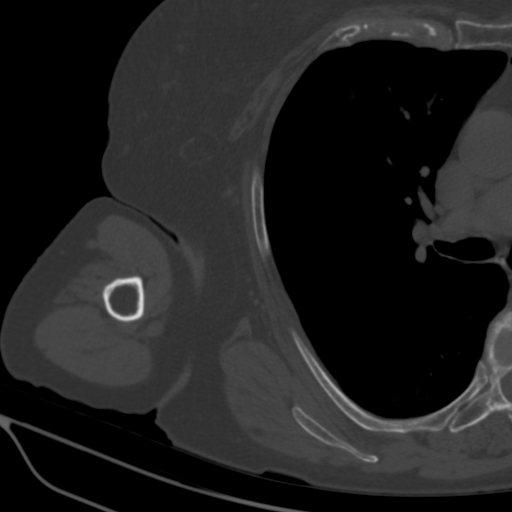
[im 42/108  soft-tissue]
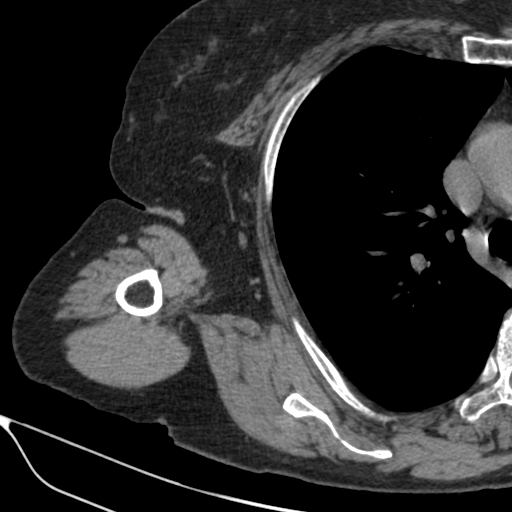
[im 42/108  bone]
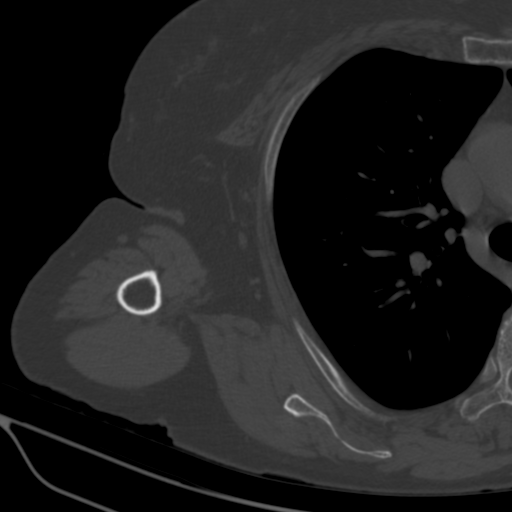
[im 50/108  bone]
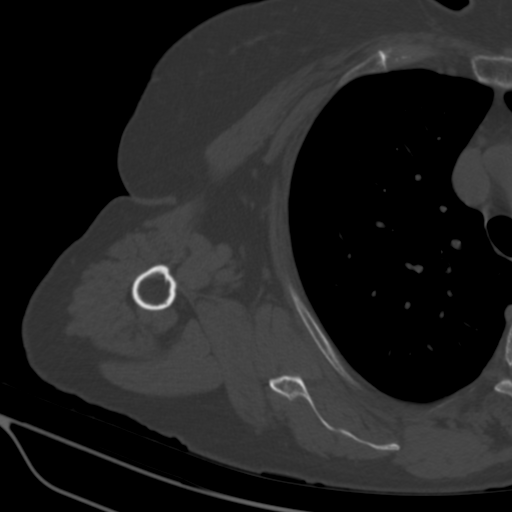
[im 58/108  bone]
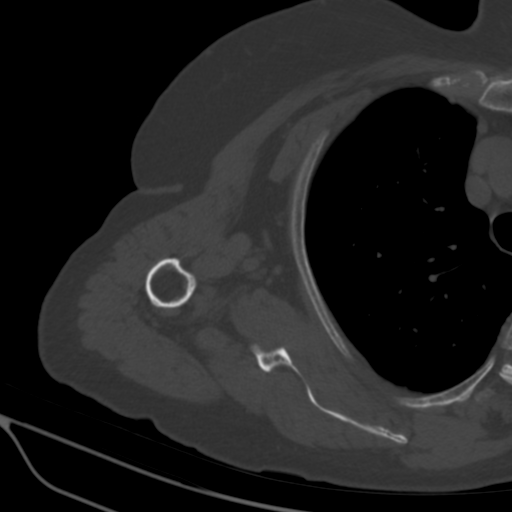
[im 66/108  bone]
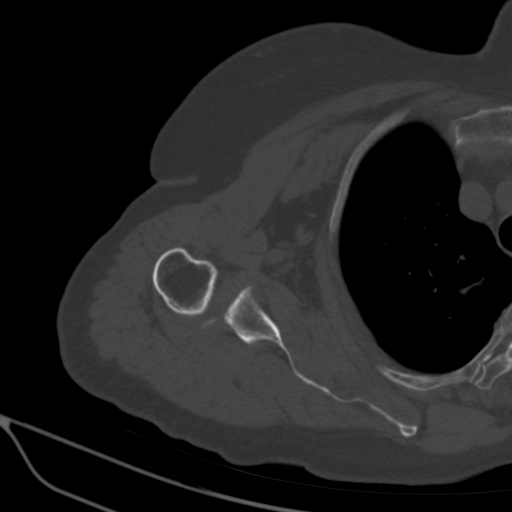
[im 75/108  soft-tissue]
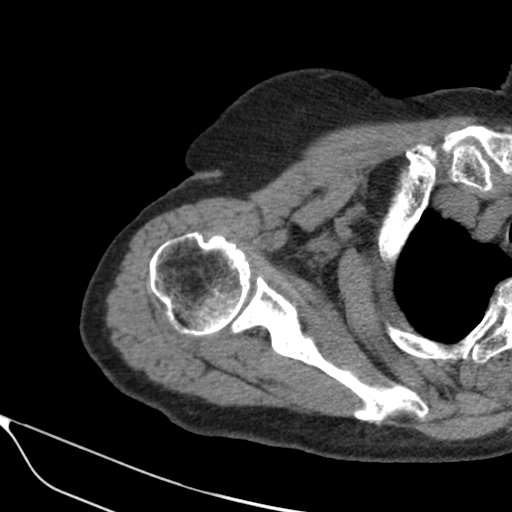
[im 75/108  bone]
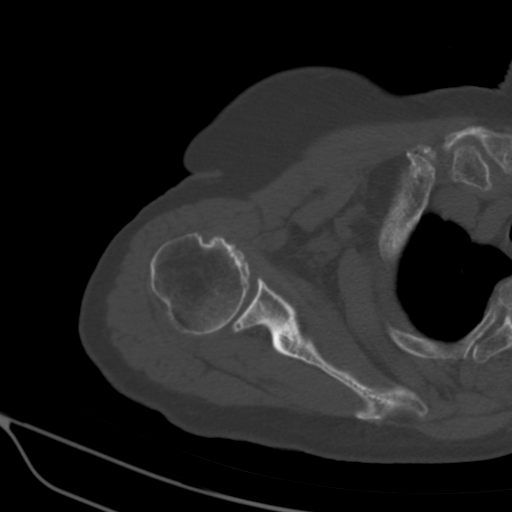
[im 83/108  bone]
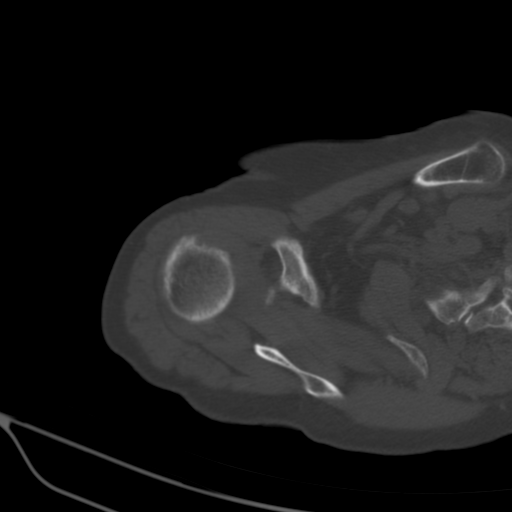
[im 91/108  bone]
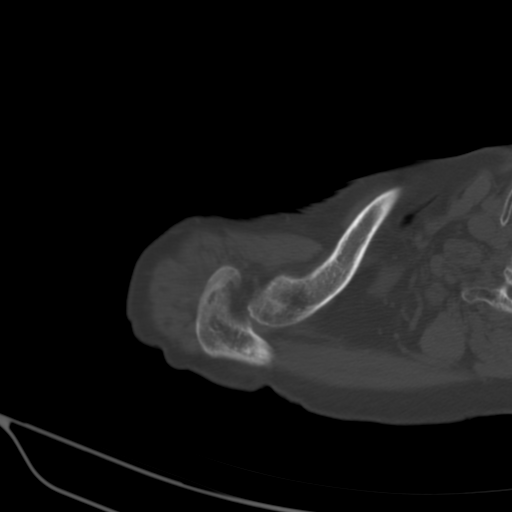
[im 99/108  bone]
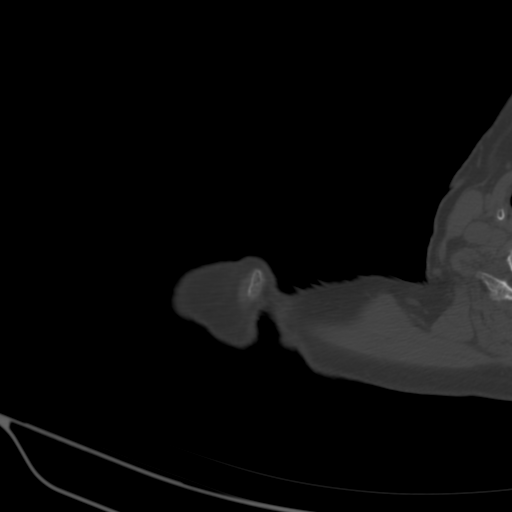

[12 of 14 positions shown; findings below may reference images not displayed]

FINDINGS: Bones/Joint/Cartilage

There is moderately severe osteoarthritis of the glenohumeral joint
with marginal osteophytes on the glenoid and humeral head with
subcortical cyst formation primarily along the inferior rim of the
glenoid and on the lesser tuberosity of the proximal humerus. No
discrete loose bodies in the joint.

Narrowing of the joint space particularly posteriorly indicating
extensive cartilage loss on the glenoid and humeral head.

Slight AC joint arthropathy.  Type 2 acromion.

Muscles and Tendons

No atrophy of the muscles of the rotator cuff.

Soft tissues

Normal.
IMPRESSION: Moderately severe osteoarthritis of the glenohumeral joint.

## 2021-12-10 DIAGNOSIS — M47816 Spondylosis without myelopathy or radiculopathy, lumbar region: Secondary | ICD-10-CM | POA: Insufficient documentation

## 2021-12-10 DIAGNOSIS — Z85828 Personal history of other malignant neoplasm of skin: Secondary | ICD-10-CM | POA: Insufficient documentation

## 2021-12-10 DIAGNOSIS — I129 Hypertensive chronic kidney disease with stage 1 through stage 4 chronic kidney disease, or unspecified chronic kidney disease: Secondary | ICD-10-CM | POA: Insufficient documentation

## 2021-12-10 DIAGNOSIS — N182 Chronic kidney disease, stage 2 (mild): Secondary | ICD-10-CM | POA: Insufficient documentation

## 2022-04-02 DIAGNOSIS — I442 Atrioventricular block, complete: Secondary | ICD-10-CM | POA: Insufficient documentation

## 2022-04-09 HISTORY — PX: PACEMAKER IMPLANT: EP1218

## 2022-05-23 DIAGNOSIS — Z950 Presence of cardiac pacemaker: Secondary | ICD-10-CM | POA: Insufficient documentation

## 2022-11-04 NOTE — Patient Instructions (Signed)
SURGICAL WAITING ROOM VISITATION Patients having surgery or a procedure may have no more than 2 support people in the waiting area - these visitors may rotate in the visitor waiting room.   Due to an increase in RSV and influenza rates and associated hospitalizations, children ages 21 and under may not visit patients in [place]. If the patient needs to stay at the hospital during part of their recovery, the visitor guidelines for inpatient rooms apply.  PRE-OP VISITATION  Pre-op nurse will coordinate an appropriate time for 1 support person to accompany the patient in pre-op.  This support person may not rotate.  This visitor will be contacted when the time is appropriate for the visitor to come back in the pre-op area.  Please refer to the [facility] website for the visitor guidelines for Inpatients (after your surgery is over and you are in a regular room).  You are not required to quarantine at this time prior to your surgery. However, you must do this: Hand Hygiene often Do NOT share personal items Notify your provider if you are in close contact with someone who has COVID or you develop fever 100.4 or greater, new onset of sneezing, cough, sore throat, shortness of breath or body aches.  If you test positive for Covid or have been in contact with anyone that has tested positive in the last 10 days please notify you surgeon.    Your procedure is scheduled on:  Thursday  November 13, 2022  Report to [facility] Main Entrance: [address] entrance where the [employer] is available.   Report to admitting at:  07:30 AM  +++++Call this number if you have any questions or problems the morning of surgery [phone]  Do not eat food after Midnight the night prior to your surgery/procedure.  After Midnight you may have the following liquids until 07:00 AM / PM DAY OF SURGERY  Clear Liquid Diet Water Black Coffee (sugar ok, NO MILK/CREAM OR CREAMERS)  Tea (sugar ok,  NO MILK/CREAM OR CREAMERS) regular and decaf                             Plain Jell-O  with no fruit (NO RED)                                           Fruit ices (not with fruit pulp, NO RED)                                     Popsicles (NO RED)                                                                  Juice: apple, WHITE grape, WHITE cranberry Sports drinks like Gatorade or Powerade (NO RED)                   The day of surgery:  Drink ONE (1) Pre-Surgery Clear Ensure at   07:00  AM the morning of surgery. Drink in one sitting.  Do not sip.  This drink was given to you during your hospital pre-op appointment visit. Nothing else to drink after completing the Pre-Surgery Clear Ensure : No candy, chewing gum or throat lozenges.    FOLLOW  ANY ADDITIONAL PRE OP INSTRUCTIONS YOU RECEIVED FROM YOUR SURGEON'S OFFICE!!!   Oral Hygiene is also important to reduce your risk of infection.        Remember - BRUSH YOUR TEETH THE MORNING OF SURGERY WITH YOUR REGULAR TOOTHPASTE  Take ONLY these medicines the morning of surgery with A SIP OF WATER: levothyroxine (Synthroid), Amlodipine.  You may take Tylenol if needed for pain   You may not have any metal on your body including hair pins, jewelry, and body piercing  Do not wear make-up, lotions, powders, perfumes or deodorant  Do not wear nail polish including gel and S&S, artificial / acrylic nails, or any other type of covering on natural nails including finger and toenails. If you have artificial nails, gel coating, etc., that needs to be removed by a nail salon, Please have this removed prior to surgery. Not doing so may mean that your surgery could be cancelled or delayed if the Surgeon or anesthesia staff feels like they are unable to monitor you safely.   Do not shave 48 hours prior to surgery to avoid nicks in your skin which may contribute to postoperative infections.   Contacts, Hearing Aids, dentures or bridgework may not be worn  into surgery. DENTURES WILL BE REMOVED PRIOR TO SURGERY PLEASE DO NOT APPLY "Poly grip" OR ADHESIVES!!!  Patients discharged on the day of surgery will not be allowed to drive home.  Someone NEEDS to stay with you for the first 24 hours after anesthesia.  Do not bring your home medications to the hospital. The Pharmacy will dispense medications listed on your medication list to you during your admission in the Hospital.  Special Instructions: Bring a copy of your healthcare power of attorney and living will documents the day of surgery, if you wish to have them scanned into your [place] Medical Records- EPIC  Please read over the following fact sheets you were given: IF YOU HAVE QUESTIONS ABOUT YOUR PRE-OP INSTRUCTIONS, PLEASE CALL [phone]  ([place])   [place] - Preparing for Surgery Before surgery, you can play an important role.  Because skin is not sterile, your skin needs to be as free of germs as possible.  You can reduce the number of germs on your skin by washing with CHG (chlorahexidine gluconate) soap before surgery.  CHG is an antiseptic cleaner which kills germs and bonds with the skin to continue killing germs even after washing. Please DO NOT use if you have an allergy to CHG or antibacterial soaps.  If your skin becomes reddened/irritated stop using the CHG and inform your nurse when you arrive at Short Stay. Do not shave (including legs and underarms) for at least 48 hours prior to the first CHG shower.  You may shave your face/neck.  Please follow these instructions carefully:  1.  Shower with CHG Soap the night before surgery and the  morning of surgery.  2.  If you choose to wash your hair, wash your hair first as usual with your normal  shampoo.  3.  After you shampoo, rinse your hair and body thoroughly to remove the shampoo.  4.  Use CHG as you would any other liquid soap.  You can apply chg directly to the skin and wash.  Gently with a  scrungie or clean washcloth.  5.  Apply the CHG Soap to your body ONLY FROM THE NECK DOWN.   Do not use on face/ open                           Wound or open sores. Avoid contact with eyes, ears mouth and genitals (private parts).                       Wash face,  Genitals (private parts) with your normal soap.             6.  Wash thoroughly, paying special attention to the area where your  surgery  will be performed.  7.  Thoroughly rinse your body with warm water from the neck down.  8.  DO NOT shower/wash with your normal soap after using and rinsing off the CHG Soap.            9.  Pat yourself dry with a clean towel.            10.  Wear clean pajamas.            11.  Place clean sheets on your bed the night of your first shower and do not  sleep with pets.  ON THE DAY OF SURGERY : Do not apply any lotions/deodorants the morning of surgery.  Please wear clean clothes to the hospital/surgery center.    FAILURE TO FOLLOW THESE INSTRUCTIONS MAY RESULT IN THE CANCELLATION OF YOUR SURGERY  PATIENT SIGNATURE_________________________________  NURSE SIGNATURE__________________________________  ________________________________________________________________________       Chelsea Mcguire    An incentive spirometer is a tool that can help keep your lungs clear and active. This tool measures how well you are filling your lungs with each breath. Taking long deep breaths may help reverse or decrease the chance of developing breathing (pulmonary) problems (especially infection) following: A long period of time when you are unable to move or be active. BEFORE THE PROCEDURE  If the spirometer includes an indicator to show your best effort, your nurse or respiratory therapist will set it to a desired goal. If possible, sit up straight or lean slightly forward. Try not to slouch. Hold the incentive spirometer in an upright position. INSTRUCTIONS FOR USE  Sit on the edge of your bed if  possible, or sit up as far as you can in bed or on a chair. Hold the incentive spirometer in an upright position. Breathe out normally. Place the mouthpiece in your mouth and seal your lips tightly around it. Breathe in slowly and as deeply as possible, raising the piston or the ball toward the top of the column. Hold your breath for 3-5 seconds or for as long as possible. Allow the piston or ball to fall to the bottom of the column. Remove the mouthpiece from your mouth and breathe out normally. Rest for a few seconds and repeat Steps 1 through 7 at least 10 times every 1-2 hours when you are awake. Take your time and take a few normal breaths between deep breaths. The spirometer may include an indicator to show your best effort. Use the indicator as a goal to work toward during each repetition. After each set of 10 deep breaths, practice coughing to be  sure your lungs are clear. If you have an incision (the cut made at the time of surgery), support your incision when coughing by placing a pillow or rolled up towels firmly against it. Once you are able to get out of bed, walk around indoors and cough well. You may stop using the incentive spirometer when instructed by your caregiver.  RISKS AND COMPLICATIONS Take your time so you do not get dizzy or light-headed. If you are in pain, you may need to take or ask for pain medication before doing incentive spirometry. It is harder to take a deep breath if you are having pain. AFTER USE Rest and breathe slowly and easily. It can be helpful to keep track of a log of your progress. Your caregiver can provide you with a simple table to help with this. If you are using the spirometer at home, follow these instructions: [place] IF:  You are having difficultly using the spirometer. You have trouble using the spirometer as often as instructed. Your pain medication is not giving enough relief while using the spirometer. You develop fever of  100.5 F (38.1 C) or higher.                                                                                                    SEEK IMMEDIATE MEDICAL CARE IF:  You cough up bloody sputum that had not been present before. You develop fever of 102 F (38.9 C) or greater. You develop worsening pain at or near the incision site. MAKE SURE YOU:  Understand these instructions. Will watch your condition. Will get help right away if you are not doing well or get worse. Document Released: 03/09/2007 Document Revised: 01/19/2012 Document Reviewed: 05/10/2007 [facility] Patient Information 2014 [place], [place].

## 2022-11-04 NOTE — Progress Notes (Signed)
COVID Vaccine received:  _0  No _1  Yes Date of any COVID positive Test in last 90 days:  PCP - Lonia Mad, MD Cardiologist - Laymond Purser, MD Ervin Knack, MD    [facility]   Cardiac Clearance on Chart [address] [place], [place], [address], Ph [phone]  EP- Dr. 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) [facility] [address], [place] [address] [phone]  Chest x-ray -  EKG -  10-21-2022 at [facility] Requested  Stress Test - 06-2019  [facility] ECHO - 06-2019   [facility] Cardiac Cath -    PCR screen: _2  Ordered & Completed                      _3   No Order but Needs PROFEND                      _4   N/A for this surgery  Surgery Plan:  _5  Ambulatory                            _6  Outpatient in bed                            _7  Admit  Anesthesia:    _8  General  _9  Spinal                           _10   Choice _11   MAC  Pacemaker / ICD device _12  No _13  Yes   ( Per Dr. Levora Dredge 10-21-2022  note in CE)  Dr. [facility] Gambler at the [facility] on  5/31 2023. 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 100% ventricular paced         Patient is going for shoulder surgery on the left side on November 13, 2022 and also getting a mammogram done. She is clear from a cardiac standpoint for both of these procedures. She should have a pacemaker programmed to asynchronous mode during the surgery or have a magnet placed over the pacemaker during the surgery   Spinal Cord Stimulator:_14  No _15  Yes      (Remind patient to bring remote DOS) Other Implants:   History of Sleep Apnea? _16  No _17  Yes   CPAP used?- _18  No _19  Yes    Does the patient monitor blood sugar? _20  No _21  Yes  _22  N/A  Blood Thinner / Instructions: none Aspirin Instructions: none  ERAS Protocol Ordered: _23  No   _24  Yes PRE-SURGERY _25  ENSURE  _26  G2  Patient is to be NPO after: 07:00 am  Comments:   Activity level: Patient can / can not climb a flight of stairs without difficulty; _27  No CP  _28  No SOB, but would have ______   Patient can / can not perform ADLs without assistance.   Anesthesia review: HTN, asthma, HOH, PPM- complete AV block.  Patient denies shortness of breath, fever, cough and chest pain at PAT appointment.  Patient verbalized understanding and agreement to the Pre-Surgical Instructions that were given to them at this PAT appointment. Patient was also educated of the need to review these PAT instructions again prior to his/her surgery.I reviewed the appropriate phone  numbers to call if they have any and questions or concerns.

## 2022-11-06 ENCOUNTER — Other Ambulatory Visit: Payer: Self-pay

## 2022-11-06 ENCOUNTER — Encounter (HOSPITAL_COMMUNITY)
Admission: RE | Admit: 2022-11-06 | Discharge: 2022-11-06 | Disposition: A | Discharge status: Home / Self Care | Payer: Medicare Other | Source: Ambulatory Visit | Attending: Orthopedic Surgery | Admitting: Orthopedic Surgery

## 2022-11-06 ENCOUNTER — Encounter (HOSPITAL_COMMUNITY): Payer: Self-pay

## 2022-11-06 VITALS — BP 164/80 | HR 64 | Temp 98.7°F | Resp 18 | Ht 60.0 in | Wt 135.0 lb

## 2022-11-06 DIAGNOSIS — I10 Essential (primary) hypertension: Secondary | ICD-10-CM | POA: Diagnosis not present

## 2022-11-06 DIAGNOSIS — M19012 Primary osteoarthritis, left shoulder: Secondary | ICD-10-CM | POA: Diagnosis not present

## 2022-11-06 DIAGNOSIS — Z950 Presence of cardiac pacemaker: Secondary | ICD-10-CM | POA: Diagnosis not present

## 2022-11-06 DIAGNOSIS — J45909 Unspecified asthma, uncomplicated: Secondary | ICD-10-CM | POA: Insufficient documentation

## 2022-11-06 DIAGNOSIS — Z01812 Encounter for preprocedural laboratory examination: Secondary | ICD-10-CM | POA: Diagnosis present

## 2022-11-06 DIAGNOSIS — Z01818 Encounter for other preprocedural examination: Secondary | ICD-10-CM

## 2022-11-06 LAB — CBC
HCT: 40.1 % (ref 36.0–46.0)
Hemoglobin: 13.2 g/dL (ref 12.0–15.0)
MCH: 30.4 pg (ref 26.0–34.0)
MCHC: 32.9 g/dL (ref 30.0–36.0)
MCV: 92.4 fL (ref 80.0–100.0)
Platelets: 213 10*3/uL (ref 150–400)
RBC: 4.34 MIL/uL (ref 3.87–5.11)
RDW: 11.9 % (ref 11.5–15.5)
WBC: 6.4 10*3/uL (ref 4.0–10.5)
nRBC: 0 % (ref 0.0–0.2)

## 2022-11-06 LAB — BASIC METABOLIC PANEL
Anion gap: 8 (ref 5–15)
BUN: 14 mg/dL (ref 8–23)
CO2: 26 mmol/L (ref 22–32)
Calcium: 9.5 mg/dL (ref 8.9–10.3)
Chloride: 106 mmol/L (ref 98–111)
Creatinine, Ser: 0.78 mg/dL (ref 0.44–1.00)
GFR, Estimated: >60 mL/min (ref >60)
Glucose, Bld: 103 mg/dL — ABNORMAL HIGH (ref 70–99)
Potassium: 4.1 mmol/L (ref 3.5–5.1)
Sodium: 140 mmol/L (ref 135–145)

## 2022-11-06 LAB — SURGICAL PCR SCREEN
MRSA, PCR: NEGATIVE
Staphylococcus aureus: NEGATIVE

## 2022-11-07 NOTE — Progress Notes (Signed)
Anesthesia Chart Review   Case: [id number] Date/Time: 11/13/22 0945   Procedure: TOTAL SHOULDER ARTHROPLASTY (Left: Shoulder) - 13mn   Anesthesia type: General   Pre-op diagnosis: Left shoulder osteoarthritis   Location: [facility] ROOM 06 / [facility]   Surgeons: SJustice Britain MD       DISCUSSION:76 y.o. never smoker with h/o HTN, pacemaker in place, asthma, left shoulder OA scheduled for above procedure 11/13/2022 with Dr. KJustice Britain   Pt last seen by cardiology 10/21/2022. Per OV note, "Stress test and echocardiogram in August 2020 were unremarkable. 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 Patient has done reasonably well after the pacemaker implantation. She had a recent pacemaker interrogation in our office, the pacemaker was reportedly normal. The report is not yet available in the medical chart. She has a BCatering managerand is 100% ventricular paced. Patient is going for shoulder surgery on the left side on November 13, 2022 and also getting a mammogram done. She is clear from a cardiac standpoint for both of these procedures. She should have a pacemaker programmed to asynchronous mode during the surgery or have a magnet placed over the pacemaker during the surgery. Patient denies any chest pain, PND, orthopnea, swelling of the extremities, syncope, presyncope, palpitations, TIA or stroke like symptoms, hemoptysis, hematemesis, melena, dysuria, hematuria, oliguria, constipation, diarrhea, fever."  Device rep contacted.    VS: BP (!) 164/80 Comment: left arm sitting,  patient has white coat syndrome  Pulse 64   Temp 37.1 C (Oral)   Resp 18   Ht 5' (1.524 m)   Wt 61.2 kg   SpO2 99%   BMI 26.37 kg/m   PROVIDERS: MEmelda Fear DO is PCP    LABS: Labs reviewed: Acceptable for surgery. (all labs ordered are listed, but only abnormal results are  displayed)  Labs Reviewed  BASIC METABOLIC PANEL - Abnormal; Notable for the following components:      Result Value   Glucose, Bld 103 (*)    All other components within normal limits  SURGICAL PCR SCREEN  CBC     IMAGES:   EKG:   CV:  Past Medical History:  Diagnosis Date   Arthritis    Asthma    [place]    skin : lt. leg   Hypertension    Hypothyroidism     Past Surgical History:  Procedure Laterality Date   EYE SURGERY Bilateral    Cataracts w/ lens implants   NISSEN FUNDOPLICATION     [facility]  P[facility]  04/09/2022   [facility] [place]   TONSILLECTOMY     TOTAL SHOULDER ARTHROPLASTY Right 07/05/2020   Procedure: Right total shoulder arthroplasty;  Surgeon: SJustice Britain MD;  Location: [facility];  Service: Orthopedics;  Laterality: Right;  1220m   TY[facility]AN[facility]LAST[facility]/28/2015   [facility]  MEDICATIONS:  acetaminophen (TYLENOL) 500 MG tablet   alendronate (FOSAMAX) 70 MG tablet   amLODipine (NORVASC) 5 MG tablet   ASPERCREME LIDOCAINE EX   Cholecalciferol (VITAMIN D3) 1.25 MG (50000 UT) CAPS   Coenzyme Q10 (COQ10) 200 MG CAPS   levothyroxine (SYNTHROID) 88 MCG tablet   losartan (COZAAR) 100 MG tablet   meloxicam (MOBIC) 15 MG tablet   Multiple  Vitamin (MULTIVITAMIN WITH MINERALS) TABS tablet   simvastatin (ZOCOR) 20 MG tablet   No current facility-administered medications for this encounter.   Konrad Felix Ward, PA-C [facility] Pre-Surgical Testing [phone]

## 2022-11-13 ENCOUNTER — Other Ambulatory Visit: Payer: Self-pay

## 2022-11-13 ENCOUNTER — Encounter (HOSPITAL_COMMUNITY): Payer: Self-pay | Admitting: Orthopedic Surgery

## 2022-11-13 ENCOUNTER — Ambulatory Visit (HOSPITAL_BASED_OUTPATIENT_CLINIC_OR_DEPARTMENT_OTHER): Payer: Medicare Other | Admitting: Certified Registered"

## 2022-11-13 ENCOUNTER — Ambulatory Visit (HOSPITAL_COMMUNITY): Payer: Medicare Other | Admitting: Physician Assistant

## 2022-11-13 ENCOUNTER — Ambulatory Visit (HOSPITAL_COMMUNITY)
Admission: RE | Admit: 2022-11-13 | Discharge: 2022-11-13 | Disposition: A | Discharge status: Home / Self Care | Payer: Medicare Other | Attending: Orthopedic Surgery | Admitting: Orthopedic Surgery

## 2022-11-13 ENCOUNTER — Encounter (HOSPITAL_COMMUNITY)
Admission: RE | Disposition: A | Discharge status: Home / Self Care | Payer: Self-pay | Source: Home / Self Care | Attending: Orthopedic Surgery

## 2022-11-13 DIAGNOSIS — M25712 Osteophyte, left shoulder: Secondary | ICD-10-CM | POA: Insufficient documentation

## 2022-11-13 DIAGNOSIS — I10 Essential (primary) hypertension: Secondary | ICD-10-CM | POA: Diagnosis not present

## 2022-11-13 DIAGNOSIS — Z950 Presence of cardiac pacemaker: Secondary | ICD-10-CM | POA: Diagnosis not present

## 2022-11-13 DIAGNOSIS — E039 Hypothyroidism, unspecified: Secondary | ICD-10-CM | POA: Insufficient documentation

## 2022-11-13 DIAGNOSIS — M19012 Primary osteoarthritis, left shoulder: Secondary | ICD-10-CM

## 2022-11-13 DIAGNOSIS — Z79899 Other long term (current) drug therapy: Secondary | ICD-10-CM | POA: Insufficient documentation

## 2022-11-13 HISTORY — PX: TOTAL SHOULDER ARTHROPLASTY: SHX126

## 2022-11-13 SURGERY — ARTHROPLASTY, SHOULDER, TOTAL
Anesthesia: General | Site: Shoulder | Laterality: Left

## 2022-11-13 MED ORDER — LIDOCAINE 2% (20 MG/ML) 5 ML SYRINGE
INTRAMUSCULAR | Status: DC | PRN
  Administered 2022-11-13: 40 mg via INTRAVENOUS

## 2022-11-13 MED ORDER — OXYCODONE HCL 5 MG/5ML PO SOLN: 5.0000 mg | Freq: Once | ORAL | Status: DC | PRN

## 2022-11-13 MED ORDER — STERILE WATER FOR IRRIGATION IR SOLN
Status: DC | PRN
  Administered 2022-11-13: 2000 mL

## 2022-11-13 MED ORDER — TRANEXAMIC ACID-NACL 1000-0.7 MG/100ML-% IV SOLN
1000.0000 mg | INTRAVENOUS | Status: AC
  Administered 2022-11-13: 1000 mg via INTRAVENOUS
  Filled 2022-11-13: qty 100

## 2022-11-13 MED ORDER — BUPIVACAINE-EPINEPHRINE (PF) 0.5% -1:200000 IJ SOLN
INTRAMUSCULAR | Status: DC | PRN
  Administered 2022-11-13 (×5): 3 mL via PERINEURAL

## 2022-11-13 MED ORDER — ACETAMINOPHEN 325 MG PO TABS: 325.0000 mg | ORAL_TABLET | ORAL | Status: DC | PRN

## 2022-11-13 MED ORDER — VANCOMYCIN HCL 1000 MG IV SOLR
INTRAVENOUS | Status: AC
  Filled 2022-11-13: qty 20

## 2022-11-13 MED ORDER — MELOXICAM 15 MG PO TABS: 15.0000 mg | ORAL_TABLET | Freq: Every day | ORAL | 1 refills | Status: DC

## 2022-11-13 MED ORDER — FENTANYL CITRATE PF 50 MCG/ML IJ SOSY: 25.0000 ug | PREFILLED_SYRINGE | INTRAMUSCULAR | Status: DC | PRN

## 2022-11-13 MED ORDER — PROPOFOL 10 MG/ML IV BOLUS
INTRAVENOUS | Status: DC | PRN
  Administered 2022-11-13: 150 mg via INTRAVENOUS

## 2022-11-13 MED ORDER — ONDANSETRON HCL 4 MG/2ML IJ SOLN: 4.0000 mg | Freq: Four times a day (QID) | INTRAMUSCULAR | Status: DC | PRN

## 2022-11-13 MED ORDER — OXYCODONE HCL 5 MG PO TABS: 5.0000 mg | ORAL_TABLET | Freq: Once | ORAL | Status: DC | PRN

## 2022-11-13 MED ORDER — ONDANSETRON HCL 4 MG PO TABS: 4.0000 mg | ORAL_TABLET | Freq: Four times a day (QID) | ORAL | Status: DC | PRN

## 2022-11-13 MED ORDER — SUGAMMADEX SODIUM 200 MG/2ML IV SOLN
INTRAVENOUS | Status: DC | PRN
  Administered 2022-11-13: 120 mg via INTRAVENOUS

## 2022-11-13 MED ORDER — VANCOMYCIN HCL 1000 MG IV SOLR
INTRAVENOUS | Status: DC | PRN
  Administered 2022-11-13: 1000 mg via TOPICAL

## 2022-11-13 MED ORDER — BUPIVACAINE LIPOSOME 1.3 % IJ SUSP
INTRAMUSCULAR | Status: DC | PRN
  Administered 2022-11-13 (×5): 2 mL via PERINEURAL

## 2022-11-13 MED ORDER — ROCURONIUM BROMIDE 10 MG/ML (PF) SYRINGE
PREFILLED_SYRINGE | INTRAVENOUS | Status: AC
  Filled 2022-11-13: qty 10

## 2022-11-13 MED ORDER — LACTATED RINGERS IV SOLN: INTRAVENOUS | Status: DC

## 2022-11-13 MED ORDER — FENTANYL CITRATE PF 50 MCG/ML IJ SOSY
100.0000 ug | PREFILLED_SYRINGE | INTRAMUSCULAR | Status: DC
  Administered 2022-11-13: 100 ug via INTRAVENOUS
  Filled 2022-11-13: qty 2

## 2022-11-13 MED ORDER — ROCURONIUM BROMIDE 10 MG/ML (PF) SYRINGE
PREFILLED_SYRINGE | INTRAVENOUS | Status: DC | PRN
  Administered 2022-11-13: 50 mg via INTRAVENOUS

## 2022-11-13 MED ORDER — ACETAMINOPHEN 10 MG/ML IV SOLN: 1000.0000 mg | Freq: Once | INTRAVENOUS | Status: DC | PRN

## 2022-11-13 MED ORDER — ORAL CARE MOUTH RINSE: 15.0000 mL | Freq: Once | OROMUCOSAL | Status: AC

## 2022-11-13 MED ORDER — 0.9 % SODIUM CHLORIDE (POUR BTL) OPTIME
TOPICAL | Status: DC | PRN
  Administered 2022-11-13: 1000 mL

## 2022-11-13 MED ORDER — DEXAMETHASONE SODIUM PHOSPHATE 10 MG/ML IJ SOLN
INTRAMUSCULAR | Status: AC
  Filled 2022-11-13: qty 1

## 2022-11-13 MED ORDER — CHLORHEXIDINE GLUCONATE 0.12 % MT SOLN
15.0000 mL | Freq: Once | OROMUCOSAL | Status: AC
  Administered 2022-11-13: 15 mL via OROMUCOSAL

## 2022-11-13 MED ORDER — LACTATED RINGERS IV BOLUS
500.0000 mL | Freq: Once | INTRAVENOUS | Status: AC
  Administered 2022-11-13: 500 mL via INTRAVENOUS

## 2022-11-13 MED ORDER — ONDANSETRON HCL 4 MG PO TABS: 4.0000 mg | ORAL_TABLET | Freq: Three times a day (TID) | ORAL | 0 refills | Status: DC | PRN

## 2022-11-13 MED ORDER — LACTATED RINGERS IV BOLUS
250.0000 mL | Freq: Once | INTRAVENOUS | Status: AC
  Administered 2022-11-13: 250 mL via INTRAVENOUS

## 2022-11-13 MED ORDER — ONDANSETRON HCL 4 MG/2ML IJ SOLN: 4.0000 mg | Freq: Once | INTRAMUSCULAR | Status: DC | PRN

## 2022-11-13 MED ORDER — PHENYLEPHRINE HCL-NACL 20-0.9 MG/250ML-% IV SOLN
INTRAVENOUS | Status: DC | PRN
  Administered 2022-11-13: 20 ug/min via INTRAVENOUS

## 2022-11-13 MED ORDER — METOCLOPRAMIDE HCL 5 MG PO TABS: 5.0000 mg | ORAL_TABLET | Freq: Three times a day (TID) | ORAL | Status: DC | PRN

## 2022-11-13 MED ORDER — LIDOCAINE HCL (PF) 2 % IJ SOLN
INTRAMUSCULAR | Status: AC
  Filled 2022-11-13: qty 5

## 2022-11-13 MED ORDER — PROPOFOL 10 MG/ML IV BOLUS
INTRAVENOUS | Status: AC
  Filled 2022-11-13: qty 20

## 2022-11-13 MED ORDER — CYCLOBENZAPRINE HCL 10 MG PO TABS: 10.0000 mg | ORAL_TABLET | Freq: Three times a day (TID) | ORAL | 1 refills | Status: DC | PRN

## 2022-11-13 MED ORDER — OXYCODONE-ACETAMINOPHEN 5-325 MG PO TABS: 1.0000 | ORAL_TABLET | ORAL | 0 refills | Status: DC | PRN

## 2022-11-13 MED ORDER — DEXAMETHASONE SODIUM PHOSPHATE 10 MG/ML IJ SOLN
INTRAMUSCULAR | Status: DC | PRN
  Administered 2022-11-13: 4 mg via INTRAVENOUS

## 2022-11-13 MED ORDER — MIDAZOLAM HCL 2 MG/2ML IJ SOLN
2.0000 mg | INTRAMUSCULAR | Status: DC
  Filled 2022-11-13: qty 2

## 2022-11-13 MED ORDER — ACETAMINOPHEN 160 MG/5ML PO SOLN: 325.0000 mg | ORAL | Status: DC | PRN

## 2022-11-13 MED ORDER — METOCLOPRAMIDE HCL 5 MG/ML IJ SOLN: 5.0000 mg | Freq: Three times a day (TID) | INTRAMUSCULAR | Status: DC | PRN

## 2022-11-13 MED ORDER — CEFAZOLIN SODIUM-DEXTROSE 2-4 GM/100ML-% IV SOLN
2.0000 g | INTRAVENOUS | Status: AC
  Administered 2022-11-13: 2 g via INTRAVENOUS
  Filled 2022-11-13: qty 100

## 2022-11-13 MED ORDER — ONDANSETRON HCL 4 MG/2ML IJ SOLN
INTRAMUSCULAR | Status: DC | PRN
  Administered 2022-11-13: 4 mg via INTRAVENOUS

## 2022-11-13 MED ORDER — ONDANSETRON HCL 4 MG/2ML IJ SOLN
INTRAMUSCULAR | Status: AC
  Filled 2022-11-13: qty 2

## 2022-11-13 SURGICAL SUPPLY — 66 items
BAG COUNTER SPONGE SURGICOUNT (BAG) IMPLANT
BAG ZIPLOCK 12X15 (MISCELLANEOUS) ×1 IMPLANT
BLADE SAW SGTL 83.5X18.5 (BLADE) ×1 IMPLANT
BODY TRUNION ECLIPSE 39 SL (Shoulder) IMPLANT
BONE CEMENT GENTAMICIN (Cement) ×1 IMPLANT
CEMENT BONE DEPUY (Cement) ×1 IMPLANT
CEMENT BONE GENTAMICIN 40 (Cement) IMPLANT
COOLER ICEMAN CLASSIC (MISCELLANEOUS) ×1 IMPLANT
COVER BACK TABLE 60X90IN (DRAPES) ×1 IMPLANT
COVER SURGICAL LIGHT HANDLE (MISCELLANEOUS) ×1 IMPLANT
DERMABOND ADVANCED .7 DNX12 (GAUZE/BANDAGES/DRESSINGS) ×1 IMPLANT
DRAPE ORTHO SPLIT 77X108 STRL (DRAPES) ×2
DRAPE SHEET LG 3/4 BI-LAMINATE (DRAPES) ×1 IMPLANT
DRAPE SURG 17X11 SM STRL (DRAPES) ×1 IMPLANT
DRAPE SURG ORHT 6 SPLT 77X108 (DRAPES) ×2 IMPLANT
DRAPE TOP 10253 STERILE (DRAPES) ×1 IMPLANT
DRAPE U-SHAPE 47X51 STRL (DRAPES) ×1 IMPLANT
DRSG AQUACEL AG ADV 3.5X 6 (GAUZE/BANDAGES/DRESSINGS) IMPLANT
DRSG AQUACEL AG ADV 3.5X10 (GAUZE/BANDAGES/DRESSINGS) ×1 IMPLANT
DURAPREP 26ML APPLICATOR (WOUND CARE) ×1 IMPLANT
ELECT BLADE TIP CTD 4 INCH (ELECTRODE) ×1 IMPLANT
ELECT PENCIL ROCKER SW 15FT (MISCELLANEOUS) ×1 IMPLANT
ELECT REM PT RETURN 15FT ADLT (MISCELLANEOUS) ×1 IMPLANT
FACESHIELD WRAPAROUND (MASK) ×5 IMPLANT
FACESHIELD WRAPAROUND OR TEAM (MASK) ×5 IMPLANT
GAUZE SPONGE 4X4 12PLY STRL (GAUZE/BANDAGES/DRESSINGS) ×1 IMPLANT
GLENOID WITH CLEAT SM (Miscellaneous) IMPLANT
GLOVE BIO SURGEON STRL SZ7.5 (GLOVE) ×1 IMPLANT
GLOVE BIO SURGEON STRL SZ8 (GLOVE) ×1 IMPLANT
GLOVE SS BIOGEL STRL SZ 7 (GLOVE) ×1 IMPLANT
GLOVE SS BIOGEL STRL SZ 7.5 (GLOVE) ×1 IMPLANT
GOWN STRL SURGICAL XL XLNG (GOWN DISPOSABLE) ×2 IMPLANT
HEAD HUMERAL ECLIPSE 39/18 (Shoulder) IMPLANT
IMPL ECLIPSE SPEEDCAP (Shoulder) IMPLANT
IMPLANT ECLIPSE SPEEDCAP (Shoulder) ×1 IMPLANT
KIT BASIN OR (CUSTOM PROCEDURE TRAY) ×1 IMPLANT
KIT TURNOVER KIT A (KITS) IMPLANT
MANIFOLD NEPTUNE II (INSTRUMENTS) ×1 IMPLANT
NDL TAPERED W/ NITINOL LOOP (MISCELLANEOUS) IMPLANT
NEEDLE TAPERED W/ NITINOL LOOP (MISCELLANEOUS) IMPLANT
NS IRRIG 1000ML POUR BTL (IV SOLUTION) ×1 IMPLANT
PACK SHOULDER (CUSTOM PROCEDURE TRAY) ×1 IMPLANT
PAD ARMBOARD 7.5X6 YLW CONV (MISCELLANEOUS) ×1 IMPLANT
PAD COLD SHLDR WRAP-ON (PAD) ×1 IMPLANT
PIN NITINOL TARGETER 2.8 (PIN) IMPLANT
PIN SET MODULAR GLENOID SYSTEM (PIN) IMPLANT
PROTECTOR NERVE ULNAR (MISCELLANEOUS) ×1 IMPLANT
RESTRAINT HEAD UNIVERSAL NS (MISCELLANEOUS) ×1 IMPLANT
SCREW SPINAL 40 F/CAGE LRG (Screw) IMPLANT
SIZER ECLIPSE CAGE SCREW (ORTHOPEDIC DISPOSABLE SUPPLIES) IMPLANT
SLING ARM FOAM STRAP MED (SOFTGOODS) IMPLANT
SMARTMIX MINI TOWER (MISCELLANEOUS) ×1
SUT FIBERWIRE #2 38 T-5 BLUE (SUTURE)
SUT MNCRL AB 3-0 PS2 18 (SUTURE) ×1 IMPLANT
SUT MON AB 2-0 CT1 36 (SUTURE) ×1 IMPLANT
SUT VIC AB 1 CT1 36 (SUTURE) ×1 IMPLANT
SUTURE FIBERWR #2 38 T-5 BLUE (SUTURE) IMPLANT
SUTURE TAPE 1.3 40 TPR END (SUTURE) IMPLANT
SUTURETAPE 1.3 40 TPR END (SUTURE)
TOWEL GREEN STERILE FF (TOWEL DISPOSABLE) ×1 IMPLANT
TOWEL OR 17X26 10 PK STRL BLUE (TOWEL DISPOSABLE) ×1 IMPLANT
TOWEL OR NON WOVEN STRL DISP B (DISPOSABLE) ×1 IMPLANT
TOWER SMARTMIX MINI (MISCELLANEOUS) ×1 IMPLANT
TUBE SUCTION HIGH CAP CLEAR NV (SUCTIONS) ×1 IMPLANT
TUBING CONNECTING 10 (TUBING) ×1 IMPLANT
WATER STERILE IRR 1000ML POUR (IV SOLUTION) ×2 IMPLANT

## 2022-11-13 NOTE — Op Note (Signed)
11/13/2022  11:26 AM  PATIENT:   Chelsea Mcguire  77 y.o. female  PRE-OPERATIVE DIAGNOSIS:  Left shoulder osteoarthritis  POST-OPERATIVE DIAGNOSIS: Same  PROCEDURE: Left shoulder stemless anatomic arthroplasty utilizing a size 39 Arthrex trunnion with a large cage screw, 39 x 18 humeral head, small glenoid  SURGEON:  Joeseph Verville, Metta Clines M.D.  ASSISTANTS: Jenetta Loges, PA-C  Jenetta Loges, PA-C was utilized as an Environmental consultant throughout this case, essential for help with positioning the patient, positioning extremity, tissue manipulation, implantation of the prosthesis, suture management, wound closure, and intraoperative decision-making.  ANESTHESIA:   General endotracheal and interscalene block with Exparel  EBL: Less than 100 cc  SPECIMEN: None  Drains: None   PATIENT DISPOSITION:  PACU - hemodynamically stable.    PLAN OF CARE: Discharge to home after PACU  Brief history:  Patient is a 77 year old female well-known to our practice after a previous right shoulder anatomic arthroplasty that she has done extremely well with.  She is now brought to the operating room for planned left shoulder anatomic arthroplasty.  Preoperatively, I counseled the patient regarding treatment options and risks versus benefits thereof.  Possible surgical complications were all reviewed including potential for bleeding, infection, neurovascular injury, persistent pain, loss of motion, anesthetic complication, failure of the implant, and possible need for additional surgery. They understand and accept and agrees with our planned procedure.   Procedure detail:  After undergoing routine preop evaluation the patient received prophylactic antibiotics and interscalene block with Exparel was established in the holding area by the anesthesia department.  Subsequently placed supine on the operating table and underwent the smooth induction of a general endotracheal anesthesia.  Placed in the beachchair position  and appropriately padded and protected.  The left shoulder girdle region was sterilely prepped and draped in standard fashion.  Timeout was called.  A deltopectoral approach to the left shoulder is made an approximately 8 cm incision.  Skin flaps were elevated dissection carried deeply and the deltopectoral interval was then developed from proximal to this with the vein taken laterally.  The long head biceps tendon was then tenodesed at the upper border the pectoralis major tendon with the proximal segment unroofed and excised.  Conjoined tendon mobilized and retracted medially and adhesions were divided beneath the deltoid.  The rotator cuff was then split from the apex of the bicipital groove to the base of the coracoid and the insertion of the subscap was isolated at the lesser tuberosity and an oscillating saw was then used to perform our lesser tuberosity osteotomy.  The subscapularis was then tagged mobilized and reflected medially and capsular attachments were then divided from the anterior and inferior margins of the humeral neck allowing delivery of the humeral head through the wound.  An extra medullary guide was then used to outline the proposed humeral head resection which we performed with an oscillating saw at approximately 30 degrees of retroversion.  Marginal osteophytes were then removed with a rondure.  The metaphysis was sized at a 39.  We completed preparation with the coring reamer and measured for a large cage screw.  A metal cap was then placed over the cut proximal humeral surface and we then exposed the glenoid and performed a circumferential labral resection.  A guidepin was directed into the center of the glenoid and the glenoid was then reamed to a stable subchondral bony bed with a small reamer in preparation completed with the central drill as well as the placement of the superior and inferior  peg and slot respectively and was then broached and a trial implant showed excellent fit and  fixation.  At this point the glenoid was meticulously cleaned and dried.  Cement was introduced into the superior and inferior peg site respectively bone graft applied on the central peg of the glenoid and glenoid was then impacted with excellent fit and fixation.  We then returned our attention to the metaphysis and confirmed good quality bone for stemless application.  The 39 trunnion was then placed into position after a suture tape suture was passed through the eyelet on the collar of the trunnion.  Trunnion was then impacted large cage screw was packed with bone graft harvested from the resected humeral head and the cage was then inserted achieving excellent purchase and fixation.  Trial reductions were then performed and ultimately felt that the 39-18 humeral head gives the best motion stability and soft tissue balance.  Trial was then removed.  We placed 2 additional medial row anchors at the LTO site for ultimate repair of the LTO.  The implant was then cleaned and dried and the 39 x 18 head was impacted.  A final reduction was then performed and this again showed excellent motion stability and soft tissue balance with approximately 50% translation of the humeral head over the glenoid.  This point the subscapularis was then repositioned using an apex stitch at its upper border and then the 3 medial row anchors were brought up through the bone tendon junction of the LTO and these were then passed in alternating fashion into 2 lateral anchors placed into the bicipital groove these were then repaired with a double row technique allowing excellent stability and reacquisition of the LTO to the harvest site on the metaphysis.  The rotator interval was then closed with a series of figure-of-eight suture tape sutures.  Upon completion we release adhesions about the subscapularis and confirmed the arm could easily achieve 35 degrees of external rotation without excessive tension on the subscap repair.  The wound was  then copiously irrigated.  Final hemostasis was obtained.  Vancomycin powder was then sprayed liberally throughout the deep soft tissue planes.  The deltopectoral interval was reapproximated with a series of figure-of-eight number Vicryl sutures.  2-0 Monocryl used to close the subcu layer and intracuticular 3-0 Monocryl used to close the skin followed by Dermabond and Aquacel dressing.  The left arm was placed into a sling and the patient was awakened, extubated, and taken to the recovery room in stable condition.  Marin Shutter MD   Contact # [phone]

## 2022-11-13 NOTE — Discharge Instructions (Signed)

## 2022-11-13 NOTE — H&P (Signed)
Chelsea Mcguire    Chief Complaint: Left shoulder osteoarthritis HPI: The patient is a 77 y.o. female well-known to our practice after a previous right shoulder anatomic arthroplasty back in August 2021 that she has done exceptionally well with.  She now presents with chronic and progressively increasing left shoulder pain related to severe osteoarthritis.  She is brought to the operating this time for planned left shoulder anatomic arthroplasty.  Past Medical History:  Diagnosis Date   Arthritis    Asthma    Cancer ([place])    skin : lt. leg   Hypertension    Hypothyroidism       Past Surgical History:  Procedure Laterality Date   EYE SURGERY Bilateral    Cataracts w/ lens implants   NISSEN FUNDOPLICATION     OSSICULAR RECONSTRUCTION Left 09/2014   done at [facility] by Dr. Oletta Cohn   PACEMAKER IMPLANT  04/09/2022   By Orvan Seen at [facility] in [place].   TONSILLECTOMY     TOTAL SHOULDER ARTHROPLASTY Right 07/05/2020   Procedure: Right total shoulder arthroplasty;  Surgeon: Justice Britain, MD;  Location: [facility];  Service: Orthopedics;  Laterality: Right;  116mn   [facility]   [facility]   History reviewed. No pertinent family history.  Social History:  reports that she has never smoked. She has never used smokeless tobacco. She reports that she does not drink alcohol and does not use drugs.  BMI: Estimated body mass index is 26.37 kg/m as calculated from the following:   Height as of this encounter: 5' (1.524 m).   Weight as of this encounter: 61.2 kg.  No results found for: "ALBUMIN" Diabetes: Patient does not have a diagnosis of diabetes.     Smoking Status:   reports that she has never smoked. She has never used smokeless tobacco.     Medications Prior to Admission  Medication Sig Dispense Refill   acetaminophen (TYLENOL) 500 MG tablet Take 500 mg by mouth every 6 (six) hours as needed.     alendronate  (FOSAMAX) 70 MG tablet Take 70 mg by mouth every Wednesday.     amLODipine (NORVASC) 5 MG tablet Take 5 mg by mouth daily.     ASPERCREME LIDOCAINE EX Apply 1 application topically 3 (three) times daily as needed (pain (left arm)).     Cholecalciferol (VITAMIN D3) 1.25 MG (50000 UT) CAPS Take 50,000 Units by mouth every Wednesday.     Coenzyme Q10 (COQ10) 200 MG CAPS Take 200 mg by mouth at bedtime.     levothyroxine (SYNTHROID) 88 MCG tablet Take 88 mcg by mouth daily before breakfast.      losartan (COZAAR) 100 MG tablet Take 100 mg by mouth at bedtime.      meloxicam (MOBIC) 15 MG tablet Take 15 mg by mouth daily with lunch.     Multiple Vitamin (MULTIVITAMIN WITH MINERALS) TABS tablet Take 1 tablet by mouth at bedtime. Centrum For Women     simvastatin (ZOCOR) 20 MG tablet Take 20 mg by mouth at bedtime.        Physical Exam: Left shoulder demonstrates painful and guarded motion as noted at her recent office visit.  She demonstrates functional motion and has excellent strength to manual muscle testing.  Examination otherwise as documented in recent office visits.  Plain radiographs confirm severe osteoarthritis with complete obliteration of the joint space, subchondral sclerosis, and peripheral osteophyte formation.  Vitals  Temp:  [  97.8 F (36.6 C)] 97.8 F (36.6 C) (01/04 0757) Pulse Rate:  [75] 75 (01/04 0757) Resp:  [16] 16 (01/04 0757) BP: (163)/(71) 163/71 (01/04 0757) SpO2:  [97 %] 97 % (01/04 0757) Weight:  [61.2 kg] 61.2 kg (01/04 0751)  Assessment/Plan  Impression: Left shoulder osteoarthritis  Plan of Action: Procedure(s): TOTAL SHOULDER ARTHROPLASTY  Chelsea Mcguire 11/13/2022, 9:18 AM Contact # [phone]

## 2022-11-13 NOTE — Anesthesia Procedure Notes (Signed)
Anesthesia Regional Block: Interscalene brachial plexus block   Pre-Anesthetic Checklist: , timeout performed,  Correct Patient, Correct Site, Correct Laterality,  Correct Procedure, Correct Position, site marked,  Risks and benefits discussed,  Surgical consent,  Pre-op evaluation,  At surgeon's request and post-op pain management  Laterality: Left and Upper  Prep: chloraprep       Needles:  Injection technique: Single-shot  Needle Type: Echogenic Stimulator Needle     Needle Length: 9cm  Needle Gauge: 20   Needle insertion depth: 1 cm   Additional Needles:   Procedures:,,,, ultrasound used (permanent image in chart),,    Narrative:  Start time: 11/13/2022 9:30 AM End time: 11/13/2022 9:37 AM Injection made incrementally with aspirations every 5 mL.  Performed by: Personally  Anesthesiologist: Lyn Hollingshead, MD

## 2022-11-13 NOTE — Progress Notes (Signed)
[employer] rep at bedside.

## 2022-11-13 NOTE — Evaluation (Signed)
Occupational Therapy Evaluation and Discharge Patient Details Name: Chelsea Mcguire MRN: [id number] DOB: 1945-12-24 Today's Date: 11/13/2022   History of Present Illness Pt is a 77 yo female s/p left shoulder stemless anatomic arthroplasty> PHMs: RTSA, HTN, asthma, skin cancer   Clinical Impression   This 77 yo female admitted and underwent above presents to acute OT with all education completed with pt and pt's husband. No further acute OT needs. Follow up therapy per surgeon.We will D/C from acute OT.      Recommendations for follow up therapy are one component of a multi-disciplinary discharge planning process, led by the attending physician.  Recommendations may be updated based on patient status, additional functional criteria and insurance authorization.   Follow Up Recommendations   (follow up per surgeon)     Assistance Recommended at Discharge PRN  Patient can return home with the following A lot of help with bathing/dressing/bathroom;Assistance with cooking/housework;Assist for transportation    Functional Status Assessment  Patient has had a recent decline in their functional status and demonstrates the ability to make significant improvements in function in a reasonable and predictable amount of time. (without further OT needs at present; surgeon to direct pt if any further therapy needed)  Equipment Recommendations  None recommended by OT       Precautions / Restrictions Precautions Precautions: Shoulder Type of Shoulder Precautions: May come out of sling if sitting in controlled environment. ie while watching tv, eating etc to give neck and skin break from sling. Please sleep in sling though until 4 weeks post op.  Ok to use operative arm to assist in feeding, bathing, ADL's.   New PROM restrictions (8/18) for use in hygiene and ADL only ER 20 ABD 45 FE 60  Pendulums are to be gentle and are the preferred exercise to be instructed for patients to perform at home.( Along  with elbow wrist and hand exercise)  Please instruct on [employer] use. NWBing Shoulder Interventions: Shoulder sling/immobilizer Precaution Booklet Issued: Yes (comment) Required Braces or Orthoses: Sling Restrictions Weight Bearing Restrictions: Yes LUE Weight Bearing: Non weight bearing      Mobility Bed Mobility               General bed mobility comments: pt in recliner upon arrival    Transfers Overall transfer level: Independent                        Balance Overall balance assessment: Mild deficits observed, not formally tested                                              Vision Patient Visual Report: No change from baseline              Pertinent Vitals/Pain Pain Assessment Pain Assessment: No/denies pain (block still working)     Hand Dominance Right   Extremity/Trunk Assessment Upper Extremity Assessment Upper Extremity Assessment: LUE deficits/detail LUE Deficits / Details: shoulder sx this admission LUE Coordination: decreased fine motor;decreased gross motor           Communication Communication Communication: No difficulties   Cognition Arousal/Alertness: Awake/alert Behavior During Therapy: WFL for tasks assessed/performed Overall Cognitive Status: Within Functional Limits for tasks assessed  Shoulder Instructions Shoulder Instructions Donning/doffing shirt without moving shoulder: Caregiver independent with task Method for sponge bathing under operated UE:  (pt and husband verbalized understanding) Donning/doffing sling/immobilizer: Caregiver independent with task Correct positioning of sling/immobilizer: Caregiver independent with task Pendulum exercises (written home exercise program):  (pt and husband verbalized understanding, post my demonstration) ROM for elbow, wrist and digits of operated UE:  (pt and husband verbalized understanding  post my demonstration--pt can do partial hand, wrist, and forearm currently--the rest is still being affected by block) Sling wearing schedule (on at all times/off for ADL's):  (pt and husband verbalized understanding) Dressing change:  (NA) Positioning of UE while sleeping:  (pt and husband verbalized understanding)    Home Living Family/patient expects to be discharged to:: Private residence Living Arrangements: Spouse/significant other Available Help at Discharge: Family;Available 24 hours/day Type of Home: House                                  Prior Functioning/Environment Prior Level of Function : Independent/Modified Independent                        OT Problem List: Decreased strength;Decreased range of motion;Impaired UE functional use         OT Goals(Current goals can be found in the care plan section) Acute Rehab OT Goals Patient Stated Goal: to go home today         AM-PAC OT "6 Clicks" Daily Activity     Outcome Measure Help from another person eating meals?: A Little Help from another person taking care of personal grooming?: A Little Help from another person toileting, which includes using toliet, bedpan, or urinal?: A Little Help from another person bathing (including washing, rinsing, drying)?: A Little Help from another person to put on and taking off regular upper body clothing?: A Lot Help from another person to put on and taking off regular lower body clothing?: A Little 6 Click Score: 17   End of Session Equipment Utilized During Treatment: Other (comment) (sling and ice machine (educated on donning and how it works)) Copy:  (pt ready to go from therapy standpoint)  Activity Tolerance: Patient tolerated treatment well Patient left: in chair;with family/visitor present  OT Visit Diagnosis: Muscle weakness (generalized) (M62.81)                Time: 9449-6759 OT Time Calculation (min): 32 min Charges:  OT General  Charges $OT Visit: 1 Visit OT Evaluation $OT Eval Moderate Complexity: 1 Mod OT Treatments $Self Care/Home Management : 8-22 mins  Golden Circle, OTR/L Acute Rehab Services [facility] [phone] Office [phone]    Almon Register 11/13/2022, 2:09 PM

## 2022-11-13 NOTE — Anesthesia Postprocedure Evaluation (Signed)
Anesthesia Post Note  Patient: Chelsea Mcguire  Procedure(s) Performed: TOTAL SHOULDER ARTHROPLASTY (Left: Shoulder)     Patient location during evaluation: Phase II Anesthesia Type: General Level of consciousness: awake Pain management: pain level controlled Vital Signs Assessment: post-procedure vital signs reviewed and stable Respiratory status: spontaneous breathing Cardiovascular status: stable Postop Assessment: no apparent nausea or vomiting Anesthetic complications: no  No notable events documented.  Last Vitals:  Vitals:   11/13/22 1200 11/13/22 1215  BP: (!) 141/78 (!) 145/74  Pulse: 85 65  Resp: 16 16  Temp:  (!) 36.4 C  SpO2: 98% 99%    Last Pain:  Vitals:   11/13/22 1200  TempSrc:   PainSc: 0-No pain                 John F Salome Arnt

## 2022-11-13 NOTE — Anesthesia Procedure Notes (Signed)
Procedure Name: Intubation Date/Time: 11/13/2022 10:11 AM  Performed by: Eben Burow, CRNAPre-anesthesia Checklist: Patient identified, Emergency Drugs available, Suction available, Patient being monitored and Timeout performed Patient Re-evaluated:Patient Re-evaluated prior to induction Oxygen Delivery Method: Circle system utilized Preoxygenation: Pre-oxygenation with 100% oxygen Induction Type: IV induction Ventilation: Mask ventilation without difficulty Laryngoscope Size: Mac and 4 Grade View: Grade I Tube type: Oral Tube size: 7.0 mm Number of attempts: 1 Airway Equipment and Method: Stylet Placement Confirmation: ETT inserted through vocal cords under direct vision, positive ETCO2 and breath sounds checked- equal and bilateral Secured at: 21 cm Tube secured with: Tape Dental Injury: Teeth and Oropharynx as per pre-operative assessment

## 2022-11-13 NOTE — Transfer of Care (Signed)
Immediate Anesthesia Transfer of Care Note  Patient: Chelsea Mcguire  Procedure(s) Performed: TOTAL SHOULDER ARTHROPLASTY (Left: Shoulder)  Patient Location: PACU  Anesthesia Type:General and GA combined with regional for post-op pain  Level of Consciousness: awake, alert , and patient cooperative  Airway & Oxygen Therapy: Patient Spontanous Breathing and Patient connected to face mask oxygen  Post-op Assessment: Report given to RN and Post -op Vital signs reviewed and stable  Post vital signs: Reviewed and stable  Last Vitals:  Vitals Value Taken Time  BP 156/71 11/13/22 1139  Temp    Pulse 85 11/13/22 1140  Resp 17 11/13/22 1140  SpO2 100 % 11/13/22 1140  Vitals shown include unvalidated device data.  Last Pain:  Vitals:   11/13/22 0757  TempSrc: Oral         Complications: No notable events documented.

## 2022-11-13 NOTE — Anesthesia Preprocedure Evaluation (Addendum)
Anesthesia Evaluation  Patient identified by MRN, date of birth, ID band Patient awake    Reviewed: Allergy & Precautions, NPO status , Patient's Chart, lab work & pertinent test results  Airway Mallampati: I       Dental no notable dental hx.    Pulmonary    Pulmonary exam normal        Cardiovascular hypertension, Pt. on medications Normal cardiovascular exam+ pacemaker      Neuro/Psych negative neurological ROS  negative psych ROS   GI/Hepatic negative GI ROS, Neg liver ROS,,,  Endo/Other  Hypothyroidism    Renal/GU negative Renal ROS  negative genitourinary   Musculoskeletal   Abdominal Normal abdominal exam  (+)   Peds  Hematology negative hematology ROS (+)   Anesthesia Other Findings   Reproductive/Obstetrics                             Anesthesia Physical Anesthesia Plan  ASA: 2  Anesthesia Plan: General   Post-op Pain Management: Regional block*   Induction: Intravenous  PONV Risk Score and Plan: 3 and Ondansetron, Dexamethasone and Treatment may vary due to age or medical condition  Airway Management Planned: Oral ETT  Additional Equipment: None  Intra-op Plan:   Post-operative Plan: Extubation in OR  Informed Consent: I have reviewed the patients History and Physical, chart, labs and discussed the procedure including the risks, benefits and alternatives for the proposed anesthesia with the patient or authorized representative who has indicated his/her understanding and acceptance.     Dental advisory given  Plan Discussed with: CRNA  Anesthesia Plan Comments:        Anesthesia Quick Evaluation

## 2022-11-13 NOTE — Progress Notes (Signed)
[employer] rep notified patient is in recovery and he will be here to reset her pacer in 20 minutes.

## 2022-11-18 ENCOUNTER — Encounter (HOSPITAL_COMMUNITY): Payer: Self-pay | Admitting: Orthopedic Surgery

## 2022-12-29 DIAGNOSIS — L570 Actinic keratosis: Secondary | ICD-10-CM | POA: Insufficient documentation

## 2023-07-21 DIAGNOSIS — K5730 Diverticulosis of large intestine without perforation or abscess without bleeding: Secondary | ICD-10-CM | POA: Insufficient documentation

## 2023-08-02 DIAGNOSIS — K644 Residual hemorrhoidal skin tags: Secondary | ICD-10-CM | POA: Insufficient documentation

## 2024-03-28 DIAGNOSIS — I480 Paroxysmal atrial fibrillation: Secondary | ICD-10-CM | POA: Insufficient documentation

## 2024-03-28 DIAGNOSIS — I471 Supraventricular tachycardia, unspecified: Secondary | ICD-10-CM | POA: Insufficient documentation

## 2024-06-23 DIAGNOSIS — M810 Age-related osteoporosis without current pathological fracture: Secondary | ICD-10-CM | POA: Insufficient documentation

## 2024-06-23 DIAGNOSIS — N1831 Chronic kidney disease, stage 3a: Secondary | ICD-10-CM | POA: Insufficient documentation

## 2024-08-22 ENCOUNTER — Encounter: Payer: Self-pay | Admitting: Internal Medicine

## 2024-08-22 ENCOUNTER — Ambulatory Visit (INDEPENDENT_AMBULATORY_CARE_PROVIDER_SITE_OTHER): Admitting: Internal Medicine

## 2024-08-22 VITALS — BP 110/60 | HR 74 | Temp 98.2°F | Ht 60.0 in | Wt 132.0 lb

## 2024-08-22 DIAGNOSIS — K219 Gastro-esophageal reflux disease without esophagitis: Secondary | ICD-10-CM | POA: Insufficient documentation

## 2024-08-22 DIAGNOSIS — Z85828 Personal history of other malignant neoplasm of skin: Secondary | ICD-10-CM | POA: Insufficient documentation

## 2024-08-22 DIAGNOSIS — M4310 Spondylolisthesis, site unspecified: Secondary | ICD-10-CM | POA: Insufficient documentation

## 2024-08-22 DIAGNOSIS — M810 Age-related osteoporosis without current pathological fracture: Secondary | ICD-10-CM

## 2024-08-22 DIAGNOSIS — M546 Pain in thoracic spine: Secondary | ICD-10-CM | POA: Diagnosis not present

## 2024-08-22 DIAGNOSIS — Z9889 Other specified postprocedural states: Secondary | ICD-10-CM

## 2024-08-22 DIAGNOSIS — G8929 Other chronic pain: Secondary | ICD-10-CM

## 2024-08-22 DIAGNOSIS — H6123 Impacted cerumen, bilateral: Secondary | ICD-10-CM | POA: Diagnosis not present

## 2024-08-22 DIAGNOSIS — M79605 Pain in left leg: Secondary | ICD-10-CM | POA: Insufficient documentation

## 2024-08-22 DIAGNOSIS — E039 Hypothyroidism, unspecified: Secondary | ICD-10-CM

## 2024-08-22 DIAGNOSIS — E079 Disorder of thyroid, unspecified: Secondary | ICD-10-CM | POA: Insufficient documentation

## 2024-08-22 DIAGNOSIS — M7552 Bursitis of left shoulder: Secondary | ICD-10-CM | POA: Insufficient documentation

## 2024-08-22 DIAGNOSIS — M19019 Primary osteoarthritis, unspecified shoulder: Secondary | ICD-10-CM | POA: Insufficient documentation

## 2024-08-22 DIAGNOSIS — M19012 Primary osteoarthritis, left shoulder: Secondary | ICD-10-CM | POA: Insufficient documentation

## 2024-08-22 DIAGNOSIS — Z950 Presence of cardiac pacemaker: Secondary | ICD-10-CM | POA: Diagnosis not present

## 2024-08-22 DIAGNOSIS — S62524A Nondisplaced fracture of distal phalanx of right thumb, initial encounter for closed fracture: Secondary | ICD-10-CM | POA: Insufficient documentation

## 2024-08-22 DIAGNOSIS — M799 Soft tissue disorder, unspecified: Secondary | ICD-10-CM | POA: Insufficient documentation

## 2024-08-22 DIAGNOSIS — N182 Chronic kidney disease, stage 2 (mild): Secondary | ICD-10-CM

## 2024-08-22 MED ORDER — LEVOTHYROXINE SODIUM 88 MCG PO TABS: 88.0000 ug | ORAL_TABLET | Freq: Every day | ORAL | 4 refills | Status: AC

## 2024-08-22 MED ORDER — DEBROX 6.5 % OT SOLN: 5.0000 [drp] | Freq: Two times a day (BID) | OTIC | 0 refills | Status: AC

## 2024-08-22 NOTE — Patient Instructions (Addendum)
 It was a pleasure seeing you today! Your health and satisfaction are our top priorities.  Bernardino Cone, MD  VISIT SUMMARY: Chelsea Mcguire is a 78 year old female who came in for a follow-up on her medical conditions. We discussed her history of atrial fibrillation, thyroid management, osteoporosis treatment, skin issues, and a recent development of a small hernia. Her current medications and recent changes were reviewed.  YOUR PLAN: -OSTEOPOROSIS: Osteoporosis is a condition where bones become weak and brittle. You are currently managing this with weekly vitamin D supplements and an injection therapy, possibly Evenity, every six months. Continue with your current treatment plan.  -HYPOTHYROIDISM: Hypothyroidism is when your thyroid gland doesn't produce enough thyroid hormone. You are taking levothyroxine 88 mcg daily. We will send a year's supply of this medication to your pharmacy at [facility].  -HIATAL HERNIA STATUS POST NISSEN FUNDOPLICATION: A hiatal hernia occurs when part of the stomach pushes up through the diaphragm. You have a small hernia but no symptoms of GERD or difficulty swallowing. No further intervention is needed unless symptoms worsen. Follow up with your GI doctor if needed.  INSTRUCTIONS: Please follow up with your GI doctor if your hernia symptoms worsen. Continue your current medications and treatments as discussed. Your levothyroxine prescription will be sent to your pharmacy at [facility].   Your Providers PCP: Henriette Anes, DO,  [phone]) Referring Provider: Henriette Anes, DO,  [phone])  NEXT STEPS: [x]  Early Intervention: Schedule sooner appointment, call our on-call services, or go to emergency room if there is any significant Increase in pain or discomfort New or worsening symptoms Sudden or severe changes in your health [x]  Flexible Follow-Up: We recommend a No follow-ups on file. for optimal routine care. This allows for  progress monitoring and treatment adjustments. [x]  Preventive Care: Schedule your annual preventive care visit! It's typically covered by insurance and helps identify potential health issues early. [x]  Lab & X-ray Appointments: Incomplete tests scheduled today, or call to schedule. X-rays: [facility] (M-F, 8:30am-noon or 1pm-5pm). [x]  Medical Information Release: Sign a release form at front desk to obtain relevant medical information we don't have.  MAKING THE MOST OF OUR FOCUSED 20 MINUTE APPOINTMENTS: [x]   Clearly state your top concerns at the beginning of the visit to focus our discussion [x]   If you anticipate you will need more time, please inform the front desk during scheduling - we can book multiple appointments in the same week. [x]   If you have transportation problems- use our convenient video appointments or ask about transportation support. [x]   We can get down to business faster if you use MyChart to update information before the visit and submit non-urgent questions before your visit. Thank you for taking the time to provide details through MyChart.  Let our nurse know and she can import this information into your encounter documents.  Arrival and Wait Times: [x]   Arriving on time ensures that everyone receives prompt attention. [x]   Early morning (8a) and afternoon (1p) appointments tend to have shortest wait times. [x]   Unfortunately, we cannot delay appointments for late arrivals or hold slots during phone calls.  Getting Answers and Following Up [x]   Simple Questions & Concerns: For quick questions or basic follow-up after your visit, reach us  at [phone] or MyChart messaging. [x]   Complex Concerns: If your concern is more complex, scheduling an appointment might be best. Discuss this with the staff to find the most suitable option. [x]   Lab & Imaging Results: We'll  contact you directly if results are abnormal or you don't use MyChart. Most normal  results will be on MyChart within 2-3 business days, with a review message from Dr. Jesus. Haven't heard back in 2 weeks? Need results sooner? Contact us  at [phone]. [x]   Referrals: Our referral coordinator will manage specialist referrals. The specialist's office should contact you within 2 weeks to schedule an appointment. Call us  if you haven't heard from them after 2 weeks.  Staying Connected [x]   MyChart: Activate your MyChart for the fastest way to access results and message us . See the last page of this paperwork for instructions on how to activate.  Bring to Your Next Appointment [x]   Medications: Please bring all your medication bottles to your next appointment to ensure we have an accurate record of your prescriptions. [x]   Health Diaries: If you're monitoring any health conditions at home, keeping a diary of your readings can be very helpful for discussions at your next appointment.  Billing [x]   X-ray & Lab Orders: These are billed by separate companies. Contact the invoicing company directly for questions or concerns. [x]   Visit Charges: Discuss any billing inquiries with our administrative services team.  Your Satisfaction Matters [x]   Share Your Experience: We strive for your satisfaction! If you have any complaints, or preferably compliments, please let Dr. Jesus know directly or contact our Practice Administrators, Manuelita Rubin or [employer], by asking at the front desk.   Reviewing Your Records [x]   Review this early draft of your clinical encounter notes below and the final encounter summary tomorrow on MyChart after its been completed.  All orders placed so far are visible here: Pacemaker  Chronic bilateral thoracic back pain  Bilateral impacted cerumen -     Debrox; Place 5 drops into the right ear 2 (two) times daily.  Dispense: 15 mL; Refill: 0  Acquired hypothyroidism -     Levothyroxine Sodium; Take 1 tablet (88 mcg total) by mouth daily before  breakfast.  Dispense: 90 tablet; Refill: 4  Chronic kidney disease, stage 2 (mild) -     Urinalysis, Routine w reflex microscopic  Senile osteoporosis  History of fundoplication

## 2024-08-22 NOTE — Assessment & Plan Note (Signed)
 Hypothyroidism is managed with levothyroxine. She is taking levothyroxine 88 mcg, despite a discrepancy in the medication list indicating it had not been refilled in four years. She confirms ongoing use of the medication. Send a year supply of levothyroxine 88 mcg to the [place]  pharmacy in [facility].

## 2024-08-22 NOTE — Assessment & Plan Note (Signed)
 For complete AVB

## 2024-08-22 NOTE — Assessment & Plan Note (Signed)
 Chronic prior workup

## 2024-08-22 NOTE — Assessment & Plan Note (Signed)
 Osteoporosis is managed with vitamin D supplementation and an injection therapy, possibly Evenity, administered biannually. She is uncertain about the specific medication but is receiving treatment for bone density. Continue vitamin D supplementation weekly and the current injection therapy.

## 2024-08-22 NOTE — Assessment & Plan Note (Signed)
 She was unaware this on chart and the data we have doesn't fully support it. Lab Results  Component Value Date   BUN 14 11/06/2022   BUN 13 06/21/2020   CREATININE 0.78 11/06/2022   CREATININE 0.71 06/21/2020

## 2024-08-22 NOTE — Progress Notes (Signed)
 [facility]  Phone: [phone]  - Medical Office Visit -  Visit Date: 08/22/2024 Patient: Chelsea Mcguire   DOB: 07/25/1946   78 y.o. Female  MRN: [id number] Patient Care Team: Henriette Anes, DO as PCP - General (Family Medicine) Today's Health Care Provider: Bernardino KANDICE Cone, MD  ===========================================    Chief Complaint / Reason for Visit: New Pt (Pt is present to Est care with pcp ) and ear poping (Pt has had some ear poping in the right ear.)  Background: 78 y.o. female who has Chronic kidney disease, stage 2 (mild); Reticular venous varices, unspecified laterality; Actinic keratosis; Osteoarthritis; Cardiac pacemaker in situ; Complete atrioventricular block (HCC); Disorder of thyroid gland; Diverticular disease of colon; Hypertensive disorder; External hemorrhoids; Gastroesophageal reflux disease; Hearing loss; Mixed hyperlipidemia; Hypertensive renal disease; Spondylosis without myelopathy or radiculopathy, lumbar region; Mixed conductive and sensorineural hearing loss of left ear with restricted hearing of right ear; Ossicular fixation, left; Paroxysmal supraventricular tachycardia; Primary hypothyroidism; Right bundle branch block; Seasonal allergies; Senile osteoporosis; and Spondylolisthesis, grade 1 on their problem list.  Discussed the use of AI scribe software for clinical note transcription with the patient, who gave verbal consent to proceed.  History of Present Illness 78 year old female who presents for a follow-up on her medical conditions.  She has a history of atrial fibrillation and is not currently on any blood thinners, only taking a baby aspirin daily. She reports that her pacemaker checks have not mentioned any atrial fibrillation issues.  She is on levothyroxine for her thyroid but is unsure when her levels were last checked. Recent blood work did not indicate any thyroid problems.  She has osteoporosis and is currently  taking vitamin D once a week. She also receives an injection for bone density every six months, though she is unsure of the specific medication name.  She has a history of skin issues, including skin cancer, and regularly sees a dermatologist. She has an upcoming appointment with a new nurse practitioner in dermatology.  She has a history of a Nissen fundoplication and reports a recent development of a small hernia in her chest area. This has led to occasional burping but no GERD symptoms. No burning sensation or difficulty swallowing related to her hernia.  Her current medications include levothyroxine, a baby aspirin, vitamin D, CoQ10, and triamterene/hydrochlorothiazide. She has stopped taking amlodipine and simvastatin.    Problem overviews updated today: Problem  Disorder of Thyroid Gland   Thyroid Disease - Phreesia 08/20/2018   Gastroesophageal Reflux Disease   Gastroesophageal Reflux Disease - Phreesia 10/13/2023   Spondylolisthesis, Grade 1   SPONDYLOLISTHESIS, GRADE 1   Senile Osteoporosis  Paroxysmal Supraventricular Tachycardia  External Hemorrhoids  Diverticular Disease of Colon  Actinic Keratosis  Cardiac Pacemaker in Situ  Complete Atrioventricular Block (Hcc)  Chronic Kidney Disease, Stage 2 (Mild)  Hypertensive Renal Disease  Spondylosis Without Myelopathy Or Radiculopathy, Lumbar Region   DEGENERATIVE DISC DISEASE, LUMBAR    Right Bundle Branch Block  Reticular Venous Varices, Unspecified Laterality  Ossicular Fixation, Left  Hearing Loss  Osteoarthritis   Arthritis - Phreesia 08/20/2018    Mixed Hyperlipidemia  Mixed Conductive and Sensorineural Hearing Loss of Left Ear With Restricted Hearing of Right Ear  Hypertensive Disorder  Primary Hypothyroidism  Seasonal Allergies  Bursitis of Left Shoulder (Resolved)   BURSITIS OF LEFT SHOULDER   Nondisplaced Fracture of Distal Phalanx of Right Thumb, Initial Encounter for Closed Fracture (Resolved)    CLOSED NONDISPLACED FRACTURE OF  DISTAL PHALANX OF RIGHT THUMB, INITIAL ENCOUNTERCLOSED NONDISPLACED FRACTURE OF DISTAL PHALANX OF RIGHT THUMB WITH ROUTINE HEALING, SUBSEQUENT ENCOUNTER   Pain of Left Lower Extremity (Resolved)   LEFT LEG PAIN   Personal History of Other Malignant Neoplasm of Skin (Resolved)  Localized, Primary Osteoarthritis of Shoulder Region (Resolved)   PRIMARY LOCALIZED OSTEOARTHROSIS OF RIGHT SHOULDER REGION   Primary Osteoarthritis, Left Shoulder (Resolved)   ARTHRITIS OF SHOULDER REGION, LEFT   Soft Tissue Lesion of Shoulder Region (Resolved)   BURSITIS/TENDONITIS (IMPINGEMENT), SHOULDER (Renamed from DISORDER OF BURSAE AND TENDONS IN SHOULDER REGION)   Stage 3a Chronic Kidney Disease (Hcc) (Resolved)  Paroxysmal Atrial Fibrillation (Hcc) (Resolved)  History of Squamous Cell Carcinoma of Skin (Resolved)  History of Tympanoplasty of Left Ear (Resolved)  Varicose Veins of Lower Extremity (Resolved)  History of Fundoplication (Resolved)    Medications updated/reviewed: Current Outpatient Medications on File Prior to Visit  Medication Sig   acetaminophen  (TYLENOL ) 500 MG tablet Take 500 mg by mouth every 6 (six) hours as needed.   aspirin EC 81 MG tablet Take 81 mg by mouth daily. Swallow whole.   Cholecalciferol (VITAMIN D3) 1.25 MG (50000 UT) CAPS Take 50,000 Units by mouth every Wednesday.   Coenzyme Q10 (COQ10) 200 MG CAPS Take 200 mg by mouth at bedtime.   Multiple Vitamins-Minerals (CENTRUM SILVER 50+WOMEN) TABS Take by mouth.   simvastatin (ZOCOR) 20 MG tablet Take 20 mg by mouth at bedtime.    triamterene-hydrochlorothiazide (MAXZIDE-25) 37.5-25 MG tablet Take 1 tablet by mouth daily.   verapamil (CALAN) 120 MG tablet Take 120 mg by mouth 3 (three) times daily.   amLODipine (NORVASC) 5 MG tablet Take 5 mg by mouth daily.   aspirin (ASPIRIN 81) 81 MG chewable tablet Chew 81 mg by mouth daily.   verapamil (VERELAN) 120 MG 24 hr capsule Take 120 mg by  mouth at bedtime.   No current facility-administered medications on file prior to visit.   Medications Discontinued During This Encounter  Medication Reason   Multiple Vitamin (MULTIVITAMIN WITH MINERALS) TABS tablet    ASPERCREME LIDOCAINE  EX    alendronate (FOSAMAX) 70 MG tablet    meloxicam  (MOBIC ) 15 MG tablet    cyclobenzaprine  (FLEXERIL ) 10 MG tablet    ondansetron  (ZOFRAN ) 4 MG tablet    oxyCODONE -acetaminophen  (PERCOCET) 5-325 MG tablet    losartan (COZAAR) 100 MG tablet    levothyroxine (SYNTHROID) 88 MCG tablet Reorder   Current Meds  Medication Sig   acetaminophen  (TYLENOL ) 500 MG tablet Take 500 mg by mouth every 6 (six) hours as needed.   aspirin EC 81 MG tablet Take 81 mg by mouth daily. Swallow whole.   carbamide peroxide (DEBROX) 6.5 % OTIC solution Place 5 drops into the right ear 2 (two) times daily.   Cholecalciferol (VITAMIN D3) 1.25 MG (50000 UT) CAPS Take 50,000 Units by mouth every Wednesday.   Coenzyme Q10 (COQ10) 200 MG CAPS Take 200 mg by mouth at bedtime.   Multiple Vitamins-Minerals (CENTRUM SILVER 50+WOMEN) TABS Take by mouth.   simvastatin (ZOCOR) 20 MG tablet Take 20 mg by mouth at bedtime.    triamterene-hydrochlorothiazide (MAXZIDE-25) 37.5-25 MG tablet Take 1 tablet by mouth daily.   verapamil (CALAN) 120 MG tablet Take 120 mg by mouth 3 (three) times daily.   [DISCONTINUED] levothyroxine (SYNTHROID) 88 MCG tablet Take 88 mcg by mouth daily before breakfast.    [DISCONTINUED] losartan (COZAAR) 100 MG tablet Take 100 mg by mouth at bedtime.    [DISCONTINUED]  Multiple Vitamin (MULTIVITAMIN WITH MINERALS) TABS tablet Take 1 tablet by mouth at bedtime. Centrum For Women    Allergies:  Ceftin [cefuroxime], Codeine, and Penicillins Past Medical History:  has a past medical history of Allergy, Arthritis, Asthma, Cancer (HCC), History of fundoplication (03/19/2011), History of squamous cell carcinoma of skin (12/10/2021), History of tympanoplasty of left ear  (11/20/2017), Hypertension, Hypothyroidism, Localized, primary osteoarthritis of shoulder region (08/22/2024), Nondisplaced fracture of distal phalanx of right thumb, initial encounter for closed fracture (08/22/2024), Pain of left lower extremity (08/22/2024), Paroxysmal atrial fibrillation (HCC) (03/28/2024), Soft tissue lesion of shoulder region (08/22/2024), Stage 3a chronic kidney disease (HCC) (06/23/2024), and 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 Past Surgical History:   has a past surgical history that includes Nissen fundoplication; Total shoulder arthroplasty (Right, 07/05/2020); Ossicular reconstruction (Left, 09/2014); Tympanoplasty (Left, 09/28/2015); Tonsillectomy; Eye surgery (Bilateral); PACEMAKER IMPLANT (04/09/2022); and Total shoulder arthroplasty (Left, 11/13/2022). Social History:   reports that she has never smoked. She has never used smokeless tobacco. She reports that she does not drink alcohol and does not use drugs. Family History:  family history includes Cancer in her father; Heart disease in her brother, mother, and sister. Depression Screen and Health Maintenance:    08/22/2024    9:12 AM  PHQ 2/9 Scores  PHQ - 2 Score 0   Health Maintenance  Topic Date Due   Medicare Annual Wellness (AWV)  Never done   Hepatitis C Screening  Never done   DTaP/Tdap/Td (1 - Tdap) Never done   DEXA SCAN  Never done   Influenza Vaccine  06/10/2024   COVID-19 Vaccine (4 - 2025-26 season) 09/07/2024 (Originally 07/11/2024)   Pneumococcal Vaccine: 50+ Years  Completed   Zoster Vaccines- Shingrix  Completed   Meningococcal B Vaccine  Aged Out   Immunization History  Administered Date(s) Administered   INFLUENZA, HIGH DOSE SEASONAL PF 08/23/2015, 07/25/2016   Influenza Split 11/23/2003   Influenza, Seasonal, Injecte, Preservative Fre 08/23/2014, 07/18/2019   Influenza,inj,Quad PF,6+ Mos 08/14/2017, 07/11/2021   Moderna Sars-Covid-2 Vaccination 01/09/2020, 02/06/2020,  09/22/2020   Pneumococcal Conjugate-13 03/21/2014   Pneumococcal Polysaccharide-23 05/11/2015   Zoster Recombinant(Shingrix) 01/21/2018, 04/29/2018     Objective   Physical ExamBP 110/60   Pulse 74   Temp 98.2 F (36.8 C) (Temporal)   Ht 5' (1.524 m)   Wt 132 lb (59.9 kg)   SpO2 98%   BMI 25.78 kg/m  Wt Readings from Last 10 Encounters:  08/22/24 132 lb (59.9 kg)  11/13/22 135 lb (61.2 kg)  11/06/22 135 lb (61.2 kg)  07/05/20 142 lb (64.4 kg)  06/21/20 142 lb (64.4 kg)  Vital signs reviewed.  Nursing notes reviewed. Weight trend reviewed. Abnormalities and problem-specific physical exam findings:  ear wax impacted R ear General Appearance:  Well developed, well nourished, well-groomed, healthy-appearing female with Body mass index is 25.78 kg/m. No acute distress appreciable.   Skin: Clear and well-hydrated. Pulmonary:  Normal work of breathing at rest, no respiratory distress apparent. SpO2: 98 %  Musculoskeletal: She demonstrates smooth and coordinated movements throughout all major joints.All extremities are intact.  Neurological:  Awake, alert, oriented, and engaged.  No obvious focal neurological deficits or cognitive impairments.  Sensorium seems unclouded.  Psychiatric:  Appropriate mood, pleasant and cooperative demeanor, cheerful and engaged during the exam  Reviewed Results & Data Results  Last CBC Lab Results  Component Value Date   WBC 6.4 11/06/2022   HGB 13.2 11/06/2022   HCT 40.1 11/06/2022   MCV  92.4 11/06/2022   MCH 30.4 11/06/2022   RDW 11.9 11/06/2022   PLT 213 11/06/2022   Last metabolic panel Lab Results  Component Value Date   GLUCOSE 103 (H) 11/06/2022   NA 140 11/06/2022   K 4.1 11/06/2022   CL 106 11/06/2022   CO2 26 11/06/2022   BUN 14 11/06/2022   CREATININE 0.78 11/06/2022   GFRNONAA >60 11/06/2022   CALCIUM 9.5 11/06/2022   ANIONGAP 8 11/06/2022   Last lipids No results found for: CHOL, HDL, LDLCALC, LDLDIRECT, TRIG,  CHOLHDL Last hemoglobin A1c No results found for: HGBA1C Last thyroid functions No results found for: TSH, T3TOTAL, T4TOTAL, THYROIDAB Last vitamin D No results found for: 25OHVITD2, 25OHVITD3, VD25OH Last vitamin B12 and Folate No results found for: VITAMINB12, FOLATE     No results found for any visits on 08/22/24.  No visits with results within 1 Year(s) from this visit.  Latest known visit with results is:  Hospital Outpatient Visit on 11/06/2022  Component Date Value   MRSA, PCR 11/06/2022 NEGATIVE    Staphylococcus aureus 11/06/2022 NEGATIVE    Sodium 11/06/2022 140    Potassium 11/06/2022 4.1    Chloride 11/06/2022 106    CO2 11/06/2022 26    Glucose, Bld 11/06/2022 103 (H)    BUN 11/06/2022 14    Creatinine, Ser 11/06/2022 0.78    Calcium 11/06/2022 9.5    GFR, Estimated 11/06/2022 >60    Anion gap 11/06/2022 8    WBC 11/06/2022 6.4    RBC 11/06/2022 4.34    Hemoglobin 11/06/2022 13.2    HCT 11/06/2022 40.1    MCV 11/06/2022 92.4    MCH 11/06/2022 30.4    MCHC 11/06/2022 32.9    RDW 11/06/2022 11.9    Platelets 11/06/2022 213    nRBC 11/06/2022 0.0    No image results found.   No results found.  No results found.    ASSESSMENT & PLAN   Assessment & Plan Pacemaker For complete AVB  Chronic bilateral thoracic back pain Chronic prior workup  Bilateral impacted cerumen Encouraged patient to use debrox Acquired hypothyroidism Hypothyroidism is managed with levothyroxine. She is taking levothyroxine 88 mcg, despite a discrepancy in the medication list indicating it had not been refilled in four years. She confirms ongoing use of the medication. Send a year supply of levothyroxine 88 mcg to the [place]  pharm[facility] in [facility]. Chronic kidney disease, stage 2 (mild) She was unaware this on chart and the data we have doesn't fully support it. Lab Results  Component Value Date   BUN 14 11/06/2022   BUN 13 06/21/2020    CREATININE 0.78 11/06/2022   CREATININE 0.71 06/21/2020    Senile osteoporosis Osteoporosis is managed with vitamin D supplementation and an injection therapy, possibly Evenity, administered biannually. She is uncertain about the specific medication but is receiving treatment for bone density. Continue vitamin D supplementation weekly and the current injection therapy. History of fundoplication Hiatal hernia status post Nissen fundoplication   She has a hiatal hernia post Nissen fundoplication with occasional burping but no GERD symptoms or dysphagia. The hernia does not require further intervention at this time. Follow up with GI doctor if symptoms worsen.   ORDER ASSOCIATIONS  #   DIAGNOSIS / CONDITION ICD-10 ENCOUNTER ORDER     ICD-10-CM   1. Pacemaker  Z95.0     2. Chronic bilateral thoracic back pain  M54.6    G89.29     3. Bilateral impacted cerumen  H61.23 carbamide peroxide (DEBROX) 6.5 % OTIC solution    4. Acquired hypothyroidism  E03.9 levothyroxine (SYNTHROID) 88 MCG tablet    5. Chronic kidney disease, stage 2 (mild)  N18.2 Urinalysis, Routine w reflex microscopic     ED Discharge Orders          Ordered    carbamide peroxide (DEBROX) 6.5 % OTIC solution  2 times daily        08/22/24 1141    levothyroxine (SYNTHROID) 88 MCG tablet  Daily before breakfast        08/22/24 1141    Urinalysis, Routine w reflex microscopic        08/22/24 1439          Diagnoses and all orders for this visit: Pacemaker Chronic bilateral thoracic back pain Bilateral impacted cerumen -     carbamide peroxide (DEBROX) 6.5 % OTIC solution; Place 5 drops into the right ear 2 (two) times daily. Acquired hypothyroidism -     levothyroxine (SYNTHROID) 88 MCG tablet; Take 1 tablet (88 mcg total) by mouth daily before breakfast. Chronic kidney disease, stage 2 (mild) -     Urinalysis, Routine w reflex microscopic     Recommended follow up: No follow-ups on file.No future  appointments.           Additional notes: This document was synthesized by artificial intelligence ([employer]bridge) using HIPAA-compliant recording of the clinical interaction;   We discussed the use of AI scribe software for clinical note transcription with the patient, who gave verbal consent to proceed.    Additional Info: This encounter employed state-of-the-art, real-time, collaborative documentation. The patient actively reviewed and assisted in updating their electronic medical record on a shared screen, ensuring transparency and facilitating joint problem-solving for the problem list, overview, and plan. This approach promotes accurate, informed care. The treatment plan was discussed and reviewed in detail, including medication safety, potential side effects, and all patient questions. We confirmed understanding and comfort with the plan. Follow-up instructions were established, including contacting the office for any concerns, returning if symptoms worsen, persist, or new symptoms develop, and precautions for potential emergency department visits.  Initial Appointment Goals:  This initial visit focused on establishing a foundation for the patient's care. We collaboratively reviewed her medical history and medications in detail, updating the chart as shown in the encounter. Given the extensive information, we prioritized addressing her most pressing concerns, which she reported were: New Pt (Pt is present to Est care with pcp ) and ear poping (Pt has had some ear poping in the right ear.)  While the complexity of the patient's medical picture may necessitate further evaluation in subsequent visits, we were able to develop a preliminary care plan together. To expedite a comprehensive plan at the next visit, we encouraged the patient to gather relevant medical records from previous providers. This collaborative approach will ensure a more complete understanding of the patient's health and inform the  development of a personalized care plan. We look forward to continuing the conversation and working together with the patient on achieving her health goals.   Collaborative Documentation:  Today's encounter utilized real-time, dynamic patient engagement.  Patients actively participate by directly reviewing and assisting in updating their medical records through a shared screen. This transparency empowers patients to visually confirm chart updates made by the healthcare provider.  This collaborative approach facilitates problem management as we jointly update the problem list, problem overview, and assessment/plan. Ultimately, this process enhances chart accuracy and completeness,  fostering shared decision-making, patient education, and informed consent for tests and treatments.  Collaborative Treatment Planning:  Treatment plans were discussed and reviewed in detail.  Explained medication safety and potential side effects.  Encouraged participation and answered all patient questions, confirming understanding and comfort with the plan. Encouraged patient to contact our office if they have any questions or concerns. Agreed on patient returning to office if symptoms worsen, persist, or new symptoms develop.  ----------------------------------------------------- Bernardino KANDICE Cone, MD  08/22/2024 5:27 PM  [facility]:  [phone]
